# Patient Record
Sex: Female | Born: 1985 | Race: White | Hispanic: No | Marital: Single | Smoking: Current every day smoker
Health system: Southern US, Community
[De-identification: ages and names within clinical notes are randomized; demographics above are authoritative.]

## PROBLEM LIST (undated history)

## (undated) DIAGNOSIS — I10 Essential (primary) hypertension: Secondary | ICD-10-CM

## (undated) DIAGNOSIS — J40 Bronchitis, not specified as acute or chronic: Secondary | ICD-10-CM

## (undated) HISTORY — PX: TUBAL LIGATION: SHX77

## (undated) HISTORY — PX: ESSURE TUBAL LIGATION: SUR464

---
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
## 2004-02-15 ENCOUNTER — Encounter: Admission: RE | Admit: 2004-02-15 | Discharge: 2004-02-15 | Payer: Self-pay | Admitting: Family Medicine

## 2004-02-16 ENCOUNTER — Inpatient Hospital Stay (HOSPITAL_COMMUNITY): Admission: AD | Admit: 2004-02-16 | Discharge: 2004-02-18 | Payer: Self-pay | Admitting: Family Medicine

## 2004-03-24 ENCOUNTER — Other Ambulatory Visit: Admission: RE | Admit: 2004-03-24 | Discharge: 2004-03-24 | Payer: Self-pay | Admitting: Family Medicine

## 2004-11-24 ENCOUNTER — Emergency Department (HOSPITAL_COMMUNITY): Admission: EM | Admit: 2004-11-24 | Discharge: 2004-11-24 | Payer: Self-pay | Admitting: Emergency Medicine

## 2004-11-25 ENCOUNTER — Other Ambulatory Visit: Admission: RE | Admit: 2004-11-25 | Discharge: 2004-11-25 | Payer: Self-pay | Admitting: *Deleted

## 2005-02-24 IMAGING — US US OB COMP LESS 14 WK
1 series · 18 of 28 positions shown · non-contrast
Comparison: none

CLINICAL DATA: Uncertain menstrual dates.  Suspect approximately 6 weeks pregnant according to patient.  Evaluate viability and dating.
 OBSTETRICAL ULTRASOUND:
 A single living intrauterine fetus is seen with measured heart rate of 162.  Fetal movement is noted.  The crown rump length measures 4.7 cm, corresponding with a gestational age of 11 weeks 4 days.  Amniotic fluid volume is within normal limits.  Fetal anatomy could not be evaluated, however no gross morphologic abnormalities are identified.  
 No maternal uterine abnormalities are seen.  Both ovaries are visualized and are normal in appearance.  There is no evidence of adnexal masses or free fluid.
 IMPRESSION
 Single living intrauterine gestation with mean gestational age of 11 weeks 4 days and sonographic EDC of 02/11/04.  Patient?s stated LMP is inaccurate.
 Normal appearance of both ovaries.  No evidence of adnexal mass or free fluid.

[Series 1: us ob comp<14 wk · 18 of 31 slices shown]
[im 1/31]
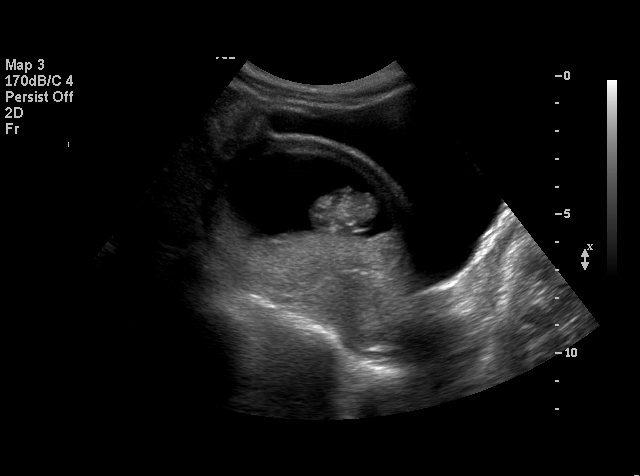
[im 3/31]
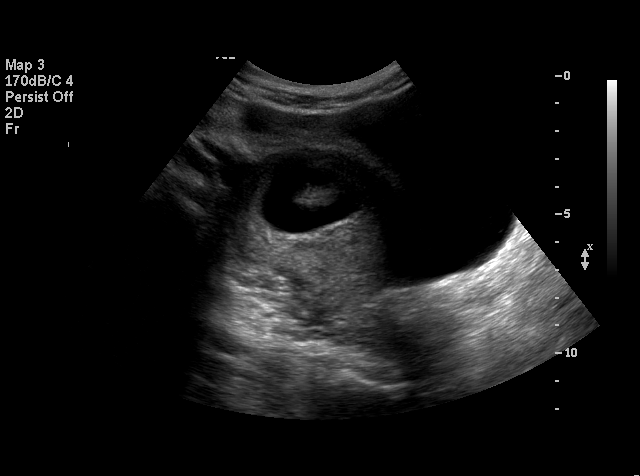
[im 4/31]
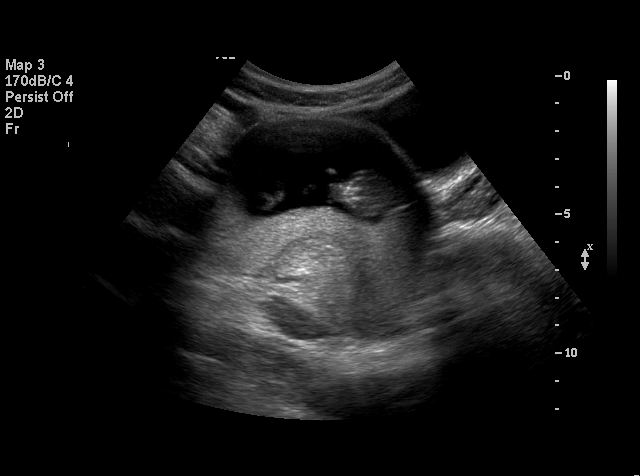
[im 6/31]
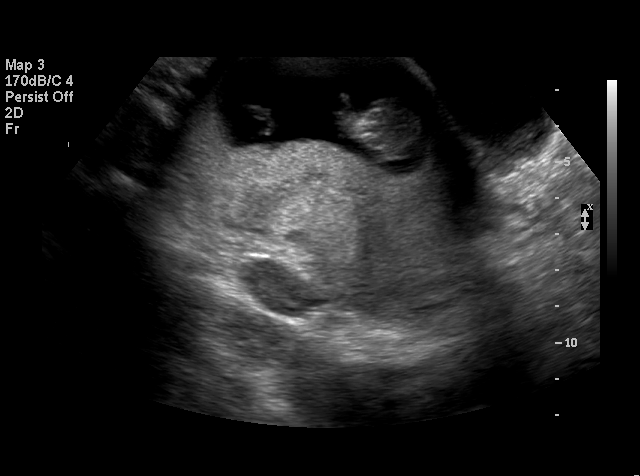
[im 8/31]
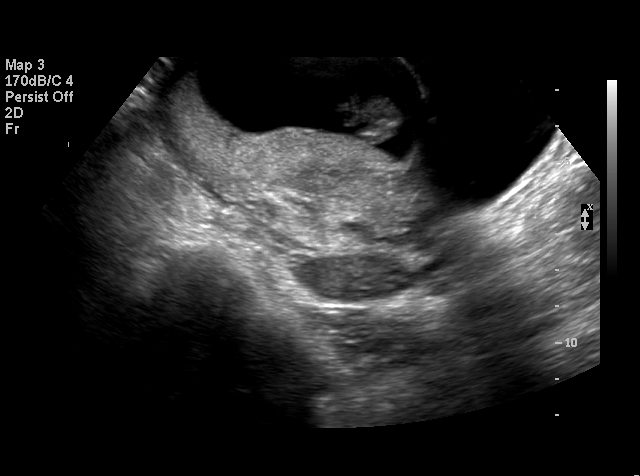
[im 9/31]
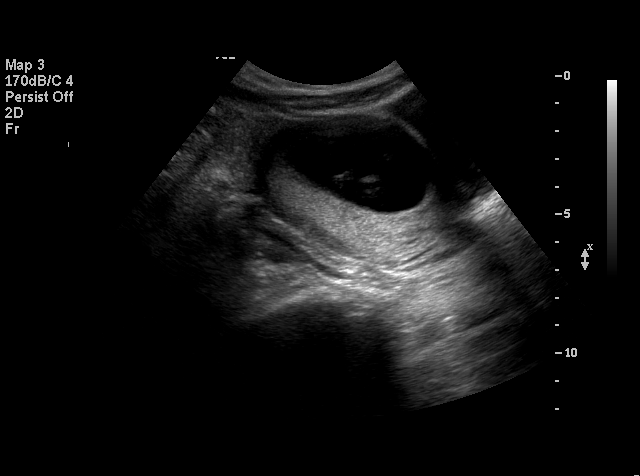
[im 12/31]
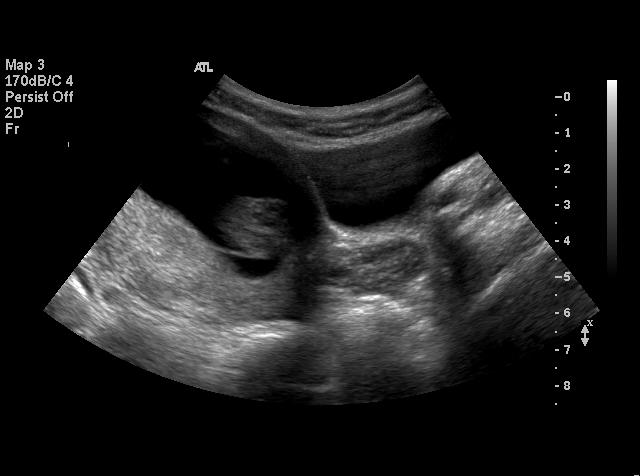
[im 13/31]
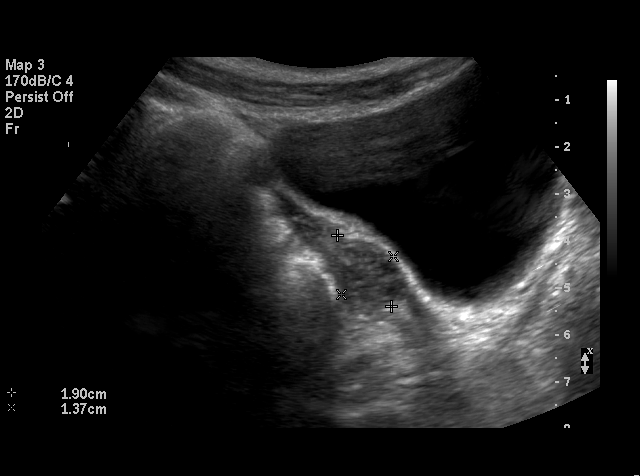
[im 15/31]
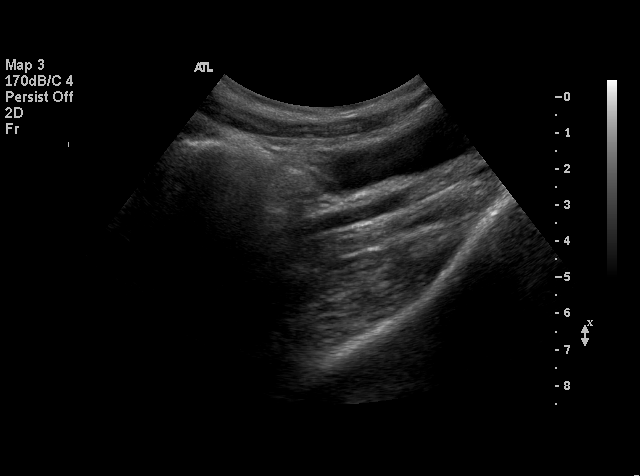
[im 16/31]
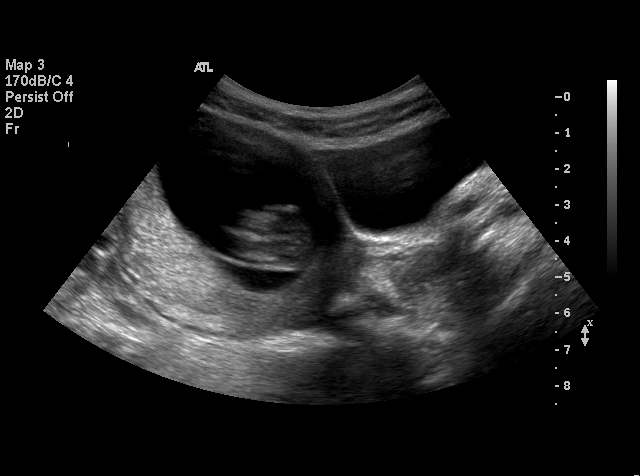
[im 18/31]
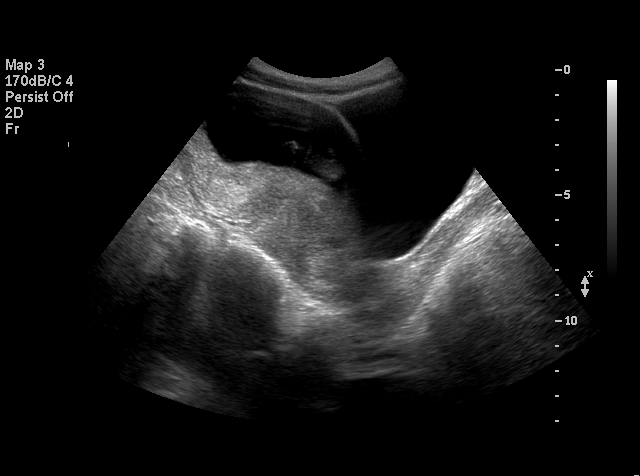
[im 19/31]
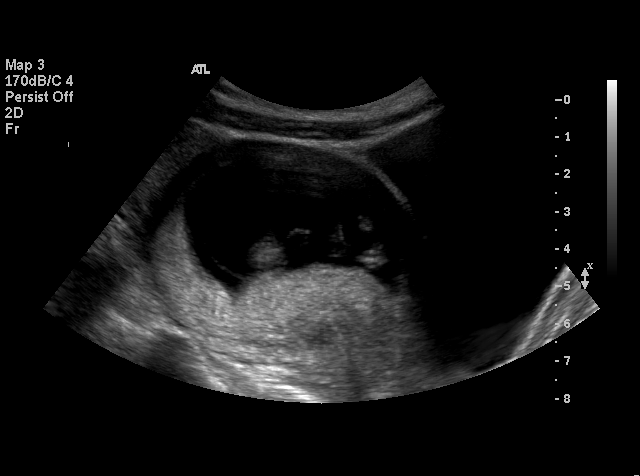
[im 22/31]
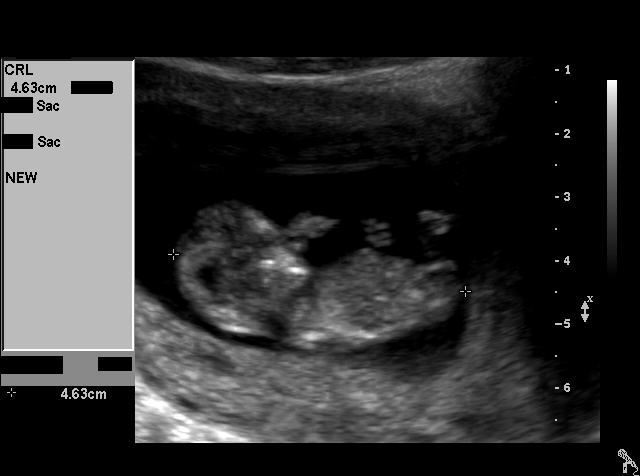
[im 24/31]
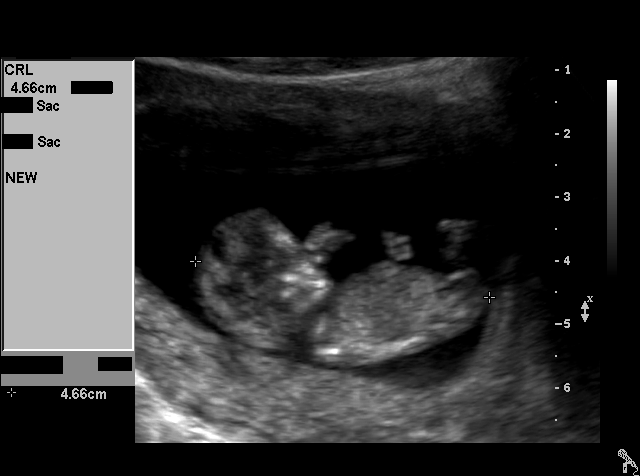
[im 25/31]
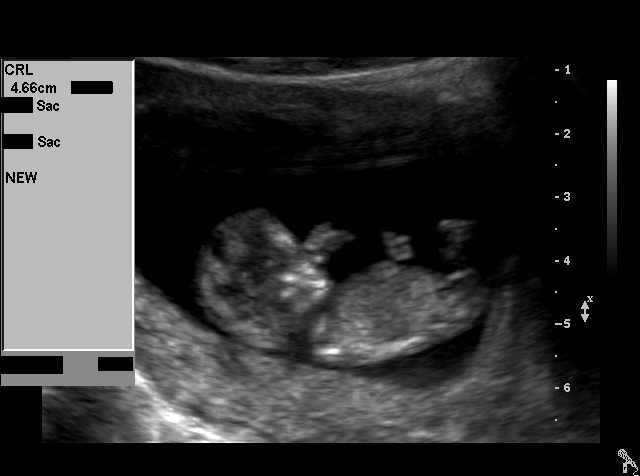
[im 27/31]
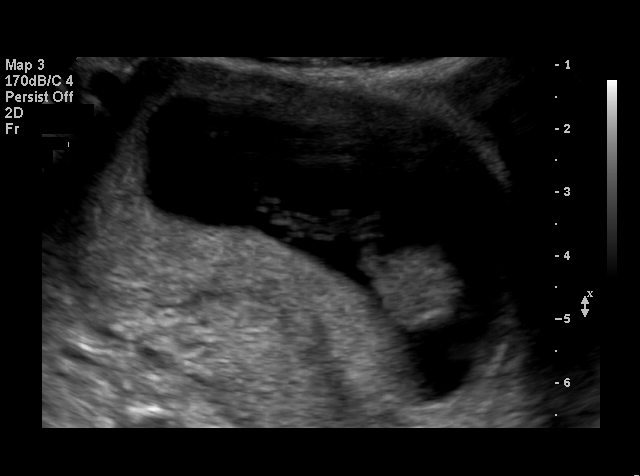
[im 28/31]
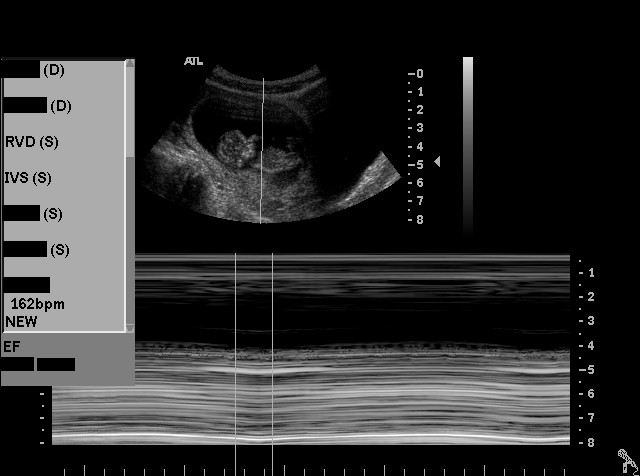
[im 31/31]
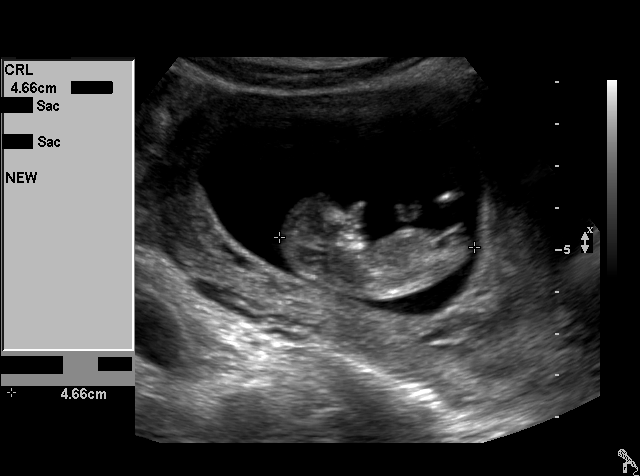

[18 of 28 positions shown; findings below may reference images not displayed]

## 2006-03-27 ENCOUNTER — Emergency Department (HOSPITAL_COMMUNITY): Admission: EM | Admit: 2006-03-27 | Discharge: 2006-03-27 | Payer: Self-pay | Admitting: Emergency Medicine

## 2006-10-21 ENCOUNTER — Ambulatory Visit (HOSPITAL_COMMUNITY): Admission: AD | Admit: 2006-10-21 | Discharge: 2006-10-21 | Payer: Self-pay | Admitting: Obstetrics and Gynecology

## 2006-11-05 ENCOUNTER — Inpatient Hospital Stay (HOSPITAL_COMMUNITY): Admission: RE | Admit: 2006-11-05 | Discharge: 2006-11-06 | Payer: Self-pay | Admitting: Obstetrics and Gynecology

## 2007-07-03 ENCOUNTER — Emergency Department (HOSPITAL_COMMUNITY): Admission: EM | Admit: 2007-07-03 | Discharge: 2007-07-03 | Payer: Self-pay | Admitting: Emergency Medicine

## 2010-08-20 ENCOUNTER — Encounter: Payer: Self-pay | Admitting: Family Medicine

## 2010-11-30 ENCOUNTER — Emergency Department (HOSPITAL_BASED_OUTPATIENT_CLINIC_OR_DEPARTMENT_OTHER)
Admission: EM | Admit: 2010-11-30 | Discharge: 2010-11-30 | Disposition: A | Discharge status: Home / Self Care | Payer: Self-pay | Attending: Emergency Medicine | Admitting: Emergency Medicine

## 2010-11-30 DIAGNOSIS — M549 Dorsalgia, unspecified: Secondary | ICD-10-CM | POA: Insufficient documentation

## 2010-11-30 DIAGNOSIS — F17200 Nicotine dependence, unspecified, uncomplicated: Secondary | ICD-10-CM | POA: Insufficient documentation

## 2010-11-30 LAB — URINALYSIS, ROUTINE W REFLEX MICROSCOPIC
Bilirubin Urine: NEGATIVE
Glucose, UA: NEGATIVE mg/dL
Hgb urine dipstick: NEGATIVE
Ketones, ur: NEGATIVE mg/dL
Nitrite: NEGATIVE
Protein, ur: NEGATIVE mg/dL
Specific Gravity, Urine: 1.006 (ref 1.005–1.030)
Urobilinogen, UA: 0.2 mg/dL (ref 0.0–1.0)
pH: 6.5 (ref 5.0–8.0)

## 2010-11-30 LAB — PREGNANCY, URINE: Preg Test, Ur: NEGATIVE

## 2010-12-16 NOTE — H&P (Signed)
NAMESUNNIE, ODDEN                ACCOUNT NO.:  [id number]   MEDICAL RECORD NO.:  [id number]          PATIENT TYPE:  INP   LOCATION:  LDR2                          FACILITY:  [facility]   PHYSICIAN:  Tilda Burrow, M.D. DATE OF BIRTH:  May 04, 1986   DATE OF ADMISSION:  11/05/2006  DATE OF DISCHARGE:  LH                              HISTORY & PHYSICAL   REASON FOR ADMISSION:  Pregnancy at 39-1/2 weeks with early active  labor.   MEDICAL HISTORY:  Negative.   SURGICAL HISTORY:  Negative.   ALLERGIES:  SHE HAS NO KNOWN ALLERGIES.   FAMILY HISTORY:  Also benign.   PRENATAL COURSE:  Uneventful.  Blood type is A positive.  Rubella  immune.  Hepatitis B surface antigen negative.  HIV is negative.  HSV-II  is negative.  Serologies nonreactive.  GC and chlamydia are negative.  AFP normal.  Twenty-eight week hemoglobin 11.4, twenty-eight week  hematocrit 35.5.  One-hour glucose is 105.   PHYSICAL EXAM:  VITAL SIGNS:  Stable.  PELVIC:  Cervix is 4 to 5 cm.  Bulging membranes.   PLAN:  We are going to admit and expect vaginal delivery.      Zerita Boers, Lanier Clam      Tilda Burrow, M.D.  Electronically Signed    DL/MEDQ  D:  16/05/9603  T:  11/05/2006  Job:  [id number]   cc:   Jeoffrey Massed, MD  Fax: [phone]

## 2010-12-16 NOTE — Consult Note (Signed)
[name], [name]                ACCOUNT NO.:  [id number]   MEDICAL RECORD NO.:  [id number]          PATIENT TYPE:  OIB   LOCATION:  A415                          FACILITY:  [facility]   PHYSICIAN:  Lazaro Arms, M.D.   DATE OF BIRTH:  16-Feb-1986   DATE OF CONSULTATION:  DATE OF DISCHARGE:  10/21/2006                                 CONSULTATION   Whitney [name] presents to Labor & Delivery at about 1530.  She is a 25-year-  old white female gravida 2, para 1, estimated date of delivery November 08, 2006, currently at 37-1/[redacted] weeks gestation who complains of a trickle of  fluid per vagina.  She stated this happened after she ate [employer].  She reports good fetal movement, no bleeding, no intercourse, really no  other complaints.  She came in and Berniece Pap did an amnio swab which  was negative, I came up and did a sterile speculum exam and there is no  fluid in the vault.  She does have some cervical mucous but no fluid  even with coughing.  Amnio swab is completely negative.  There is a  reactive NST.  The cervix is 2, still pretty thick and -1 station,  vertex and soft.  She was reassured, there was no rupture of membranes.  She was given routine aftercare instructions and she will be seen in the  office for a regular appointment or return as needed.      Lazaro Arms, M.D.  Electronically Signed     LHE/MEDQ  D:  10/21/2006  T:  10/21/2006  Job:  [id number]

## 2010-12-16 NOTE — Op Note (Signed)
NAMEPRUDENCE, HEINY                ACCOUNT NO.:  [id number]   MEDICAL RECORD NO.:  [id number]          PATIENT TYPE:  INP   LOCATION:  LDR2                          FACILITY:  [facility]   PHYSICIAN:  Lazaro Arms, M.D.   DATE OF BIRTH:  19-Apr-1986   DATE OF PROCEDURE:  DATE OF DISCHARGE:                               OPERATIVE REPORT   EPIDURAL NOTE:   Whitney [name] is a 25 year old  gravida 2, para 1, 4-5 cm dilated. had  amniotomy performed and is requesting epidural be placed.  She is placed  in a sitting position. Betadine prep is used.  1% Lidocaine is injected  in the L3-L4 interspace.  A 17 gauge Touhy needle is used and loss of  resistance technique is employed and the epidural space is found with 1  pass without difficulty.  The epidural catheter is then fed and taped  down 5 cm into the epidural space.  10cc of 0.125% Bupivacaine was given  as a test dose without ill effects.  I waited several minutes.  An  additional 10 cc is used to doses of  the epidural __________ infusion  of 0.125 % Bupivacaine with 2 mc per cc of Fentanyl was begun at 12 cc  an our.  The patient is getting good pain relief.  Fetal heart rate  tracing is stable and blood pressure is stable.      Lazaro Arms, M.D.  Electronically Signed     LHE/MEDQ  D:  11/05/2006  T:  11/05/2006  Job:  [id number]

## 2010-12-16 NOTE — Op Note (Signed)
[name], [name]                ACCOUNT NO.:  [id number]   MEDICAL RECORD NO.:  [id number]          PATIENT TYPE:  INP   LOCATION:  A401                          FACILITY:  [facility]   PHYSICIAN:  Tilda Burrow, M.D. DATE OF BIRTH:  Sep 02, 1985   DATE OF PROCEDURE:  11/05/2006  DATE OF DISCHARGE:  11/06/2006                               OPERATIVE REPORT   DELIVERY NOTE.   [name] progressed nicely, reached 9 cm dilated.  I was available so  presented to Labor & Delivery where I managed the delivery.  The infant  was delivered after a brief second stage of 12 minutes, delivering a  healthy 5 pound 8 ounce infant female, Apgars 9/9, delivered without  difficulty over an intact perineum without complications or lacerations.  Patient tolerated the procedure well.  After the baby was delivered,  placed on  maternal abdomen cord blood samples were obtained and the  placenta delivered easily intact.  Three-vessel cord confirmed.  EBL was  minimal, 250 mL estimate, and patient tolerated the procedure well with  excellent appropriate interest in the infant.      Tilda Burrow, M.D.  Electronically Signed     JVF/MEDQ  D:  11/15/2006  T:  11/16/2006  Job:  [id number]

## 2011-05-08 LAB — DIFFERENTIAL
Basophils Absolute: 0
Basophils Relative: 0
Eosinophils Absolute: 0.1 — ABNORMAL LOW
Eosinophils Relative: 1
Lymphocytes Relative: 17
Lymphs Abs: 1.7
Monocytes Absolute: 0.8
Monocytes Relative: 8
Neutro Abs: 7.2
Neutrophils Relative %: 74

## 2011-05-08 LAB — COMPREHENSIVE METABOLIC PANEL
ALT: 16
AST: 17
Albumin: 4
Alkaline Phosphatase: 69
BUN: 8
CO2: 22
Calcium: 9.2
Chloride: 105
Creatinine, Ser: 0.63
GFR calc Af Amer: >60
GFR calc non Af Amer: >60
Glucose, Bld: 83
Potassium: 3.8
Sodium: 135
Total Bilirubin: 0.7
Total Protein: 6.8

## 2011-05-08 LAB — URINALYSIS, ROUTINE W REFLEX MICROSCOPIC
Bilirubin Urine: NEGATIVE
Glucose, UA: NEGATIVE
Hgb urine dipstick: NEGATIVE
Ketones, ur: NEGATIVE
Nitrite: NEGATIVE
Protein, ur: NEGATIVE
Specific Gravity, Urine: 1.014
Urobilinogen, UA: 1
pH: 6

## 2011-05-08 LAB — CBC
HCT: 38
Hemoglobin: 12.8
MCHC: 33.6
MCV: 89.1
Platelets: 222
RBC: 4.27
RDW: 14.3
WBC: 9.8

## 2011-05-08 LAB — LIPASE, BLOOD: Lipase: 15

## 2011-05-08 LAB — PREGNANCY, URINE: Preg Test, Ur: NEGATIVE

## 2013-06-19 ENCOUNTER — Encounter (HOSPITAL_BASED_OUTPATIENT_CLINIC_OR_DEPARTMENT_OTHER): Payer: Self-pay | Admitting: Emergency Medicine

## 2013-06-19 ENCOUNTER — Emergency Department (HOSPITAL_BASED_OUTPATIENT_CLINIC_OR_DEPARTMENT_OTHER): Payer: Self-pay

## 2013-06-19 ENCOUNTER — Emergency Department (HOSPITAL_BASED_OUTPATIENT_CLINIC_OR_DEPARTMENT_OTHER)
Admission: EM | Admit: 2013-06-19 | Discharge: 2013-06-19 | Disposition: A | Discharge status: Home / Self Care | Payer: Self-pay | Attending: Emergency Medicine | Admitting: Emergency Medicine

## 2013-06-19 DIAGNOSIS — J3489 Other specified disorders of nose and nasal sinuses: Secondary | ICD-10-CM | POA: Insufficient documentation

## 2013-06-19 DIAGNOSIS — R05 Cough: Secondary | ICD-10-CM

## 2013-06-19 DIAGNOSIS — F17200 Nicotine dependence, unspecified, uncomplicated: Secondary | ICD-10-CM | POA: Insufficient documentation

## 2013-06-19 DIAGNOSIS — J029 Acute pharyngitis, unspecified: Secondary | ICD-10-CM | POA: Insufficient documentation

## 2013-06-19 DIAGNOSIS — R509 Fever, unspecified: Secondary | ICD-10-CM | POA: Insufficient documentation

## 2013-06-19 DIAGNOSIS — R059 Cough, unspecified: Secondary | ICD-10-CM | POA: Insufficient documentation

## 2013-06-19 MED ORDER — HYDROCOD POLST-CHLORPHEN POLST 10-8 MG/5ML PO LQCR: 5.0000 mL | Freq: Two times a day (BID) | ORAL | Status: DC | PRN

## 2013-06-19 NOTE — ED Notes (Signed)
Pt reports cough and fever x 1 week and burning feeling in chest and no relief from over the counter medication

## 2013-06-19 NOTE — ED Notes (Addendum)
Cough congestion x 1 week  Hot and chills,  Lungs clear,  States chest feels tight and burning

## 2013-06-19 NOTE — ED Provider Notes (Signed)
CSN: [id number]     Arrival date & time 06/19/13  0221 History   First MD Initiated Contact with Patient 06/19/13 0236     Chief Complaint  Patient presents with  . Cough   (Consider location/radiation/quality/duration/timing/severity/associated sxs/prior Treatment) HPI This is a 27 year old female with a one-week history of cough. The cough is nonproductive. It has been associated with nasal congestion, sore throat, burning in chest and subjective fever. She has not had an earache. She's had no relief from over-the-counter medications. Her cough worsened this morning and is worse lying supine. It is less severe during the daytime.  History reviewed. No pertinent past medical history. Past Surgical History  Procedure Laterality Date  . Essure tubal ligation     History reviewed. No pertinent family history. History  Substance Use Topics  . Smoking status: Current Every Day Smoker -- 0.50 packs/day    Types: Cigarettes  . Smokeless tobacco: Not on file  . Alcohol Use: No   OB History   Grav Para Term Preterm Abortions TAB SAB Ect Mult Living                 Review of Systems  All other systems reviewed and are negative.    Allergies  Percocet  Home Medications   Current Outpatient Rx  Name  Route  Sig  Dispense  Refill  . dextromethorphan 15 MG/5ML syrup   Oral   Take 10 mLs by mouth 4 (four) times daily as needed for cough.          BP 120/65  Pulse 110  Temp(Src) 98.4 F (36.9 C) (Oral)  Resp 20  Ht 5\' 1"  (1.549 m)  Wt 110 lb (49.896 kg)  BMI 20.80 kg/m2  SpO2 100%  LMP 05/15/2013  Physical Exam General: Well-developed, well-nourished female in no acute distress; appearance consistent with age of record HENT: normocephalic; atraumatic; no pharyngeal erythema or exudate; nasal congestion Eyes: pupils equal, round and reactive to light; extraocular muscles intact Neck: supple Heart: regular rate and rhythm Lungs: clear to auscultation bilaterally;  frequent cough Chest: anterior tenderness Abdomen: soft; nondistended Extremities: No deformity; full range of motion Neurologic: Awake, alert and oriented; motor function intact in all extremities and symmetric; no facial droop Skin: Warm and dry Psychiatric: Normal mood and affect    ED Course  Procedures (including critical care time)  MDM  Nursing notes and vitals signs, including pulse oximetry, reviewed.  Summary of this visit's results, reviewed by myself:  Imaging Studies: Dg Chest 2 View  06/19/2013   CLINICAL DATA:  Cough and fever for 1 week.  EXAM: CHEST  2 VIEW  COMPARISON:  None available for comparison at time of study interpretation.  FINDINGS: Cardiomediastinal silhouette is unremarkable. The lungs are clear without pleural effusions or focal consolidations. Pulmonary vasculature is unremarkable. Trachea projects midline and there is no pneumothorax. Soft tissue planes and included osseous structures are nonsuspicious.  IMPRESSION: No active cardiopulmonary disease.  Normal chest.   Electronically Signed   By: Awilda Metro   On: 06/19/2013 02:53        Hanley Seamen, MD 06/19/13 4098

## 2014-07-10 ENCOUNTER — Emergency Department (HOSPITAL_BASED_OUTPATIENT_CLINIC_OR_DEPARTMENT_OTHER)
Admission: EM | Admit: 2014-07-10 | Discharge: 2014-07-10 | Disposition: A | Discharge status: Home / Self Care | Payer: Managed Care, Other (non HMO) | Attending: Emergency Medicine | Admitting: Emergency Medicine

## 2014-07-10 ENCOUNTER — Encounter (HOSPITAL_BASED_OUTPATIENT_CLINIC_OR_DEPARTMENT_OTHER): Payer: Self-pay

## 2014-07-10 ENCOUNTER — Emergency Department (HOSPITAL_BASED_OUTPATIENT_CLINIC_OR_DEPARTMENT_OTHER): Payer: Managed Care, Other (non HMO)

## 2014-07-10 DIAGNOSIS — R0602 Shortness of breath: Secondary | ICD-10-CM | POA: Diagnosis present

## 2014-07-10 DIAGNOSIS — J209 Acute bronchitis, unspecified: Secondary | ICD-10-CM | POA: Diagnosis not present

## 2014-07-10 DIAGNOSIS — R05 Cough: Secondary | ICD-10-CM

## 2014-07-10 DIAGNOSIS — Z720 Tobacco use: Secondary | ICD-10-CM | POA: Insufficient documentation

## 2014-07-10 DIAGNOSIS — R059 Cough, unspecified: Secondary | ICD-10-CM

## 2014-07-10 HISTORY — DX: Bronchitis, not specified as acute or chronic: J40

## 2014-07-10 MED ORDER — PREDNISONE 20 MG PO TABS
40.0000 mg | ORAL_TABLET | Freq: Once | ORAL | Status: AC
  Administered 2014-07-10: 40 mg via ORAL
  Filled 2014-07-10: qty 2

## 2014-07-10 MED ORDER — ALBUTEROL SULFATE HFA 108 (90 BASE) MCG/ACT IN AERS: 2.0000 | INHALATION_SPRAY | RESPIRATORY_TRACT | Status: DC | PRN

## 2014-07-10 MED ORDER — ALBUTEROL SULFATE (2.5 MG/3ML) 0.083% IN NEBU
5.0000 mg | INHALATION_SOLUTION | Freq: Once | RESPIRATORY_TRACT | Status: AC
  Administered 2014-07-10: 5 mg via RESPIRATORY_TRACT
  Filled 2014-07-10: qty 6

## 2014-07-10 MED ORDER — AEROCHAMBER PLUS W/MASK MISC: Status: DC

## 2014-07-10 MED ORDER — PREDNISONE 10 MG PO TABS: 40.0000 mg | ORAL_TABLET | Freq: Every day | ORAL | Status: DC

## 2014-07-10 NOTE — Discharge Instructions (Signed)
Acute Bronchitis Start taking the prednisone prescribed tomorrow morning. Use your inhaler 2 puffs every 4 hours as needed for cough or shortness of breath. Return if needed more than every 4 hours. Call any of the numbers on the resource guide to get a primary care physician. Ask your new physician to help you to stop smoking. Bronchitis is when the airways that extend from the windpipe into the lungs get red, puffy, and painful (inflamed). Bronchitis often causes thick spit (mucus) to develop. This leads to a cough. A cough is the most common symptom of bronchitis. In acute bronchitis, the condition usually begins suddenly and goes away over time (usually in 2 weeks). Smoking, allergies, and asthma can make bronchitis worse. Repeated episodes of bronchitis may cause more lung problems. HOME CARE  Rest.  Drink enough fluids to keep your pee (urine) clear or pale yellow (unless you need to limit fluids as told by your doctor).  Only take over-the-counter or prescription medicines as told by your doctor.  Avoid smoking and secondhand smoke. These can make bronchitis worse. If you are a smoker, think about using nicotine gum or skin patches. Quitting smoking will help your lungs heal faster.  Reduce the chance of getting bronchitis again by:  Washing your hands often.  Avoiding people with cold symptoms.  Trying not to touch your hands to your mouth, nose, or eyes.  Follow up with your doctor as told. GET HELP IF: Your symptoms do not improve after 1 week of treatment. Symptoms include:  Cough.  Fever.  Coughing up thick spit.  Body aches.  Chest congestion.  Chills.  Shortness of breath.  Sore throat. GET HELP RIGHT AWAY IF:   You have an increased fever.  You have chills.  You have severe shortness of breath.  You have bloody thick spit (sputum).  You throw up (vomit) often.  You lose too much body fluid (dehydration).  You have a severe headache.  You  faint. MAKE SURE YOU:   Understand these instructions.  Will watch your condition.  Will get help right away if you are not doing well or get worse. Document Released: 01/03/2008 Document Revised: 03/19/2013 Document Reviewed: 01/07/2013 [facility] Patient Information 2015 [place] [place], [place]. This information is not intended to replace advice given to you by your health care provider. Make sure you discuss any questions you have with your health care provider.  Emergency Department Resource Guide 1) Find a Doctor and Pay Out of Pocket Although you won't have to find out who is covered by your insurance plan, it is a good idea to ask around and get recommendations. You will then need to call the office and see if the doctor you have chosen will accept you as a new patient and what types of options they offer for patients who are self-pay. Some doctors offer discounts or will set up payment plans for their patients who do not have insurance, but you will need to ask so you aren't surprised when you get to your appointment.  2) Contact Your Local Health Department Not all health departments have doctors that can see patients for sick visits, but many do, so it is worth a call to see if yours does. If you don't know where your local health department is, you can check in your phone book. The CDC also has a tool to help you locate your state's health department, and many state websites also have listings of all of their local health departments.  3)  Find a Walk-in Clinic If your illness is not likely to be very severe or complicated, you may want to try a walk in clinic. These are popping up all over the country in pharmacies, drugstores, and shopping centers. They're usually staffed by nurse practitioners or physician assistants that have been trained to treat common illnesses and complaints. They're usually fairly quick and inexpensive. However, if you have serious medical issues or chronic medical  problems, these are probably not your best option.  No Primary Care Doctor: - Call Health Connect at  [phone] - they can help you locate a primary care doctor that  accepts your insurance, provides certain services, etc. - Physician Referral Service- [phone]  Chronic Pain Problems: Organization         Address  Phone   Notes  [facility]  [phone] Patients need to be referred by their primary care doctor.   Medication Assistance: Organization         Address  Phone   Notes  [facility] Medication [facility] [address] [place] [place], [place] [address] [phone] --Must be a resident of [facility] -- Must have NO insurance coverage whatsoever (no Medicaid/ Medicare, etc.) -- The pt. MUST have a primary care doctor that directs their care regularly and follows them in the community   [facility]  [phone]   [employer]  [phone]    Agencies that provide inexpensive medical care: Organization         Address  Phone   Notes  [facility]  [phone]   [facility]    [phone]   [facility] [address] [place], [place] [address] [phone]   [facility] [address] [place] [place] [phone]   Planned Parenthood    [phone]   [facility]    [phone]   [facility]  [address], [place] Phone:  [phone], Fax:  [phone] Hours of Operation:  9 am - 6 pm, M-F.  Also accepts Medicaid/Medicare and self-pay.  [facility]  [address], [place] Phone: [phone], Fax: [phone]. Hours of Operation:  8:30 am - 5:30 pm, M-F.  Also accepts Medicaid and self-pay.  [facility] [place] [address] [place] Phone: [phone]   [facility] [address] Natasha Bence [place], [place] [phone], Ext. [phone]  Mondays & Thursdays: 7-9 AM.  First 15 patients are seen on a first come, first serve basis.    Medicaid-accepting [facility] Providers:  Organization         Address  Phone   Notes  [facility] [address], [place] [phone] Also accepts self-pay patients.  [facility] [address] Laurell Josephs [place], [place]  [phone]   [facility] [address], [place] [phone]   [facility] [address], [place] [phone]   Renaye Rakers [address], [place]   [phone] Only accepts [place] Access [place] patients after they have their name applied to their card.   Self-Pay (no insurance) in [facility]:  Organization         Address  Phone   Notes  Sickle Cell Patients, [facility] [address] [phone]   [facility] [place] [phone]   [facility] [place]  [address], [place] [phone]   [facility]/Dr. Osei-Bonsu  [address] [place] or [address], [place] [phone] Phone number for both [place] and [place] locations is the same.  [facility] [address] [place] [phone]   [facility] [address] [place] or [address] [phone]   [facility] [address] [place] [phone], phone; [phone], fax Sees patients 1st and 3rd Saturday of every month.  Must not qualify for public or private insurance (i.e. Medicaid, Medicare, [place] Health Choice, Veterans' Benefits)  Household income should be no more than 200% of the poverty level The clinic cannot treat you if you are pregnant or think you are pregnant  Sexually transmitted diseases are not treated at  the clinic.    Dental Care: Organization         Address  Phone  Notes  [facility] Department of [facility] [place] [phone] Accepts children up to age 56 who are enrolled in [place] or [place]; pregnant women with a Medicaid card; and children who have applied for Medicaid or [place] Health Choice, but were declined, whose parents can pay a reduced fee at time of service.  [facility] Department of [facility]  [address], [place] [phone] Accepts children up to age 65 who are enrolled in [place] or [place]; pregnant women with a Medicaid card; and children who have applied for Medicaid or [place] Health Choice, but were declined, whose parents can pay a reduced fee at time of service.  [place] Adult [facility] Access PROGRAM  [place] [phone] Patients are seen by appointment only. Walk-ins are not accepted. [place] will see patients 79 years of age and older. Monday - Tuesday (8am-5pm) Most Wednesdays (8:30-5pm) $30 per visit, cash only  [facility] Adult Dental Access PROGRAM  [address], [facility] [phone] Patients are seen by appointment only. Walk-ins are not accepted. [place] will see patients 29 years of age and older. One Wednesday Evening (Monthly: Volunteer Based).  $30 per visit, cash only  [place]  [phone] for adults; Children under age 28, call Graduate Pediatric Dentistry at [phone]. Children aged 15-14, please call [phone] to request a pediatric application.  Dental services are provided in all areas of dental care including fillings, crowns and bridges, complete and partial dentures, implants, gum treatment, root canals, and extractions. Preventive care is also provided. Treatment is provided to both adults and children. Patients are selected via a lottery and there is often a  waiting list.   [facility] [address], [place]  [phone] [email or web address]   [facility] [address] [place], [place] [phone], Ext. [phone] Second and Fourth Thursday of each month, opens at 6:30 AM; Clinic ends at 9 AM.  Patients are seen on a first-come first-served basis, and a limited number are seen during each clinic.   [facility]  [address] Hillard Danker [place], [place] [phone]   Eligibility Requirements You must have lived in [place], [place],  or [place] counties for at least the last three months.   You cannot be eligible for state or federal sponsored [employer], including [employer], [place], or [employer].   You generally cannot be eligible for healthcare insurance through your employer.    How to apply: Eligibility screenings are held every Tuesday and Wednesday afternoon from 1:00 pm until 4:00 pm. You do not need an appointment for the interview!  [facility] [address], [place], [place]   [place]  [place] Department  [place]  [phone]    Behavioral Health Resources in the Community: Intensive Outpatient Programs Organization         Address  Phone  Notes  [place] Peever. [address] [place], [place] [phone]   [facility] Outpatient [address] [place], [place]   ADS: [facility] [address] [place], [place]   [place] [address],  [place], [place] or [phone]   Substance Abuse Resources Organization         Address  Phone  Notes  Alcohol and Drug Services  [phone]   [place]  [phone]   [facility] [place]   Chinita Pester  [phone]   Residential & Outpatient Substance Abuse  Program  [phone]   Psychological Services Organization         Address  Phone  Notes  [facility] [place]  [place]  [phone]   [place] [address], [place] or [phone]    Mobile Crisis Teams Organization         Address  Phone  Notes  [facility], Mobile Crisis Care Unit  [phone]   [facility]  [address] [place], [place]   [place] [address] [place] [phone]    Self-Help/Support Groups Organization         Address  Phone             Notes  [place]. of [place] - variety of support groups  [place] Call for more information  Narcotics Anonymous (NA), [facility] [address], [employer] [place]  2 meetings at this location   Special educational needs teacher         Address  Phone  Notes  [facility] [place],    [place]  [phone]   [facility]  [address], [place] [phone], [place], [place]   [place], [place] [phone] Admissions: 8am-3pm M-F  Incentives Substance [place] [address]    [place], [place] [phone]   [facility] [address] Jadene Pierini [place], [place]   [facility] [address],  [place], [place]   [facility] Programs - Intensive Outpatient [address] Kristeen [address], [place], [place]   [facility] ([place].) [place].,  [place], [place] or [phone]   [facility] (RTS) [address], [place], [place] Accepts Medicaid  [facility] [place] [address],  [place] [phone] Substance Abuse/Addiction Treatment   [facility] Resources Organization          Address  Phone  Notes  [facility]  [phone]   Angie Fava, PhD [address] Ervin Knack [place], [place]   [phone] or [phone]   [facility] Behavioral   [address] [place], [place] [phone]   [facility] [address], [place], [place] [phone] Insurance/Medicaid/sponsorship through [facility] and Families [address]                                    [place], [place] [phone] Therapy/tele-psych/case  [facility] [address], [place] [phone]    Dr. Lolly Mustache  [phone]   [facility]  [employer] [facility] Dept. 1) [address], [place] 2) [address], [place] 3)  [address], [place] [phone]  [phone]   [facility] Child Abuse Hotline 203-371-0815 or [phone] (After Hours)

## 2014-07-10 NOTE — ED Provider Notes (Signed)
CSN: [id number]     Arrival date & time 07/10/14  0710 History   First MD Initiated Contact with Patient 07/10/14 [phone]     No chief complaint on file.  chief complaint cough shortness of breath   (Consider location/radiation/quality/duration/timing/severity/associated sxs/prior Treatment) HPI Complains of nonproductive cough, nasal congestion and shortness of breath onset 2 weeks ago. Treated with her child's nebulizerwith partial relief. Using dayquil and nyquil without relief she's also been using her husband's albuterol inhaler. She denies fever nothing makes symptoms better or worse. No other associated symptoms No past medical history on file. Past Surgical History  Procedure Laterality Date  . Essure tubal ligation     No family history on file. History  Substance Use Topics  . Smoking status: Current Every Day Smoker -- 0.50 packs/day    Types: Cigarettes  . Smokeless tobacco: Not on file  . Alcohol Use: No   no illicit drug use OB History    No data available     Review of Systems  Constitutional: Negative.   HENT: Positive for congestion.   Respiratory: Positive for cough and shortness of breath.   Cardiovascular: Negative.   Gastrointestinal: Negative.   Musculoskeletal: Negative.   Skin: Negative.   Neurological: Negative.   Psychiatric/Behavioral: Negative.   All other systems reviewed and are negative.     Allergies  Percocet  Home Medications   Prior to Admission medications   Medication Sig Start Date End Date Taking? Authorizing Provider  chlorpheniramine-HYDROcodone (TUSSIONEX PENNKINETIC ER) 10-8 MG/5ML LQCR Take 5 mLs by mouth every 12 (twelve) hours as needed for cough. 06/19/13   John L Molpus, MD   BP 127/67 mmHg  Pulse 75  Temp(Src) 98 F (36.7 C)  Resp 24  Ht 5' 1.5" (1.562 m)  Wt 103 lb (46.72 kg)  BMI 19.15 kg/m2  SpO2 100% Physical Exam  Constitutional: She appears well-developed and well-nourished. No distress.  HENT:  Head:  Normocephalic and atraumatic.  Right Ear: External ear normal.  Left Ear: External ear normal.  Mouth/Throat: Oropharynx is clear and moist. No oropharyngeal exudate.  Nasal congestion. Bilateral tympanic membranes normal  Eyes: Conjunctivae are normal. Pupils are equal, round, and reactive to light.  Neck: Neck supple. No tracheal deviation present. No thyromegaly present.  Cardiovascular: Normal rate and regular rhythm.   No murmur heard. Pulmonary/Chest: Effort normal and breath sounds normal.  Coughing occasionally  Abdominal: Soft. Bowel sounds are normal. She exhibits no distension. There is no tenderness.  Musculoskeletal: Normal range of motion. She exhibits no edema or tenderness.  Neurological: She is alert. Coordination normal.  Skin: Skin is warm and dry. No rash noted.  Psychiatric: She has a normal mood and affect.  Nursing note and vitals reviewed.   ED Course  Procedures (including critical care time) Labs Review Labs Reviewed - No data to display  Imaging Review No results found.   EKG Interpretation None     7:55 AM breathing normal patient not coughing after treatment with albuterol neb. Chest x-ray viewed by me Results for orders placed or performed during the hospital encounter of 11/30/10  Urinalysis, Routine w reflex microscopic  Result Value Ref Range   Color, Urine YELLOW YELLOW   APPearance CLEAR CLEAR   Specific Gravity, Urine 1.006 1.005 - 1.030   pH 6.5 5.0 - 8.0   Glucose, UA NEGATIVE NEGATIVE mg/dL   Hgb urine dipstick NEGATIVE NEGATIVE   Bilirubin Urine NEGATIVE NEGATIVE   Ketones, ur NEGATIVE NEGATIVE mg/dL  Protein, ur NEGATIVE NEGATIVE mg/dL   Urobilinogen, UA 0.2 0.0 - 1.0 mg/dL   Nitrite NEGATIVE NEGATIVE   Leukocytes, UA  NEGATIVE    NEGATIVE MICROSCOPIC NOT DONE ON URINES WITH NEGATIVE PROTEIN, BLOOD, LEUKOCYTES, NITRITE, OR GLUCOSE <1000 mg/dL.  Pregnancy, urine  Result Value Ref Range   Preg Test, Ur      NEGATIVE         THE SENSITIVITY OF THIS METHODOLOGY IS >24 mIU/mL   Dg Chest 2 View  07/10/2014   CLINICAL DATA:  Cough and fatigue.  Personal history of smoking.  EXAM: CHEST  2 VIEW  COMPARISON:  Two-view chest 06/19/2013  FINDINGS: Heart size is normal. Lungs are clear. The visualized soft tissues and bony thorax are unremarkable.  IMPRESSION: Negative two view chest.   Electronically Signed   By: Gennette Pachris  Mattern M.D.   On: 07/10/2014 07:35    MDM  Plan prescription prednisone his prednisone has helped her in the past. Prescription albuterol HFA with spacer use 2 puffs every 4 hours when necessary shortness of breath. I counseled patient for 5 minutes on smoking cessation Referral resource guide Diagnosis #1 acute bronchitis #2 tobacco abuse  Final diagnoses:  None        Doug SouSam Nicolette Gieske, MD 07/10/14 [phone]

## 2014-07-10 NOTE — ED Notes (Signed)
Patient transported to X-ray 

## 2014-07-10 NOTE — ED Notes (Signed)
MD at bedside. 

## 2014-07-10 NOTE — ED Notes (Signed)
Difficulty breathing and NP cough x x weeks that is progressively worsening.  Nasal congestion.  Taking Dayquil and NyQuil without relief.  Denies fever, however reports diarrhea yesterday.  Used an albuterol neb last night with relief. Denies CP, nausea or vomiting.

## 2016-03-18 ENCOUNTER — Emergency Department (HOSPITAL_BASED_OUTPATIENT_CLINIC_OR_DEPARTMENT_OTHER): Payer: BLUE CROSS/BLUE SHIELD

## 2016-03-18 ENCOUNTER — Encounter (HOSPITAL_BASED_OUTPATIENT_CLINIC_OR_DEPARTMENT_OTHER): Payer: Self-pay | Admitting: Emergency Medicine

## 2016-03-18 ENCOUNTER — Emergency Department (HOSPITAL_BASED_OUTPATIENT_CLINIC_OR_DEPARTMENT_OTHER)
Admission: EM | Admit: 2016-03-18 | Discharge: 2016-03-18 | Disposition: A | Discharge status: Home / Self Care | Payer: BLUE CROSS/BLUE SHIELD | Attending: Emergency Medicine | Admitting: Emergency Medicine

## 2016-03-18 DIAGNOSIS — N730 Acute parametritis and pelvic cellulitis: Secondary | ICD-10-CM | POA: Diagnosis not present

## 2016-03-18 DIAGNOSIS — F17210 Nicotine dependence, cigarettes, uncomplicated: Secondary | ICD-10-CM | POA: Insufficient documentation

## 2016-03-18 DIAGNOSIS — R1011 Right upper quadrant pain: Secondary | ICD-10-CM | POA: Diagnosis present

## 2016-03-18 LAB — URINALYSIS, ROUTINE W REFLEX MICROSCOPIC
Bilirubin Urine: NEGATIVE
Glucose, UA: NEGATIVE mg/dL
Ketones, ur: NEGATIVE mg/dL
Leukocytes, UA: NEGATIVE
Nitrite: NEGATIVE
Protein, ur: NEGATIVE mg/dL
Specific Gravity, Urine: 1.009 (ref 1.005–1.030)
pH: 6 (ref 5.0–8.0)

## 2016-03-18 LAB — CBC WITH DIFFERENTIAL/PLATELET
BASOS ABS: 0 10*3/uL (ref 0.0–0.1)
Basophils Relative: 0 %
Eosinophils Absolute: 0.1 10*3/uL (ref 0.0–0.7)
Eosinophils Relative: 1 %
HEMATOCRIT: 34.8 % — AB (ref 36.0–46.0)
Hemoglobin: 11.5 g/dL — ABNORMAL LOW (ref 12.0–15.0)
LYMPHS ABS: 1 10*3/uL (ref 0.7–4.0)
LYMPHS PCT: 6 %
MCH: 28.4 pg (ref 26.0–34.0)
MCHC: 33 g/dL (ref 30.0–36.0)
MCV: 85.9 fL (ref 78.0–100.0)
MONO ABS: 1.3 10*3/uL — AB (ref 0.1–1.0)
Monocytes Relative: 8 %
NEUTROS ABS: 14.2 10*3/uL — AB (ref 1.7–7.7)
Neutrophils Relative %: 85 %
Platelets: 234 10*3/uL (ref 150–400)
RBC: 4.05 MIL/uL (ref 3.87–5.11)
RDW: 15.8 % — AB (ref 11.5–15.5)
WBC: 16.6 10*3/uL — ABNORMAL HIGH (ref 4.0–10.5)

## 2016-03-18 LAB — URINE MICROSCOPIC-ADD ON

## 2016-03-18 LAB — COMPREHENSIVE METABOLIC PANEL
ALT: 11 U/L — AB (ref 14–54)
AST: 15 U/L (ref 15–41)
Albumin: 4.6 g/dL (ref 3.5–5.0)
Alkaline Phosphatase: 72 U/L (ref 38–126)
Anion gap: 8 (ref 5–15)
BILIRUBIN TOTAL: 0.7 mg/dL (ref 0.3–1.2)
BUN: 7 mg/dL (ref 6–20)
CO2: 24 mmol/L (ref 22–32)
CREATININE: 0.54 mg/dL (ref 0.44–1.00)
Calcium: 9.2 mg/dL (ref 8.9–10.3)
Chloride: 106 mmol/L (ref 101–111)
Glucose, Bld: 90 mg/dL (ref 65–99)
Potassium: 3 mmol/L — ABNORMAL LOW (ref 3.5–5.1)
Sodium: 138 mmol/L (ref 135–145)
TOTAL PROTEIN: 7.4 g/dL (ref 6.5–8.1)

## 2016-03-18 LAB — WET PREP, GENITAL
Sperm: NONE SEEN
Trich, Wet Prep: NONE SEEN
YEAST WET PREP: NONE SEEN

## 2016-03-18 LAB — PREGNANCY, URINE: Preg Test, Ur: NEGATIVE

## 2016-03-18 MED ORDER — DOXYCYCLINE HYCLATE 100 MG PO CAPS: 100.0000 mg | ORAL_CAPSULE | Freq: Two times a day (BID) | ORAL | 0 refills | Status: DC

## 2016-03-18 MED ORDER — CEFTRIAXONE SODIUM 250 MG IJ SOLR
250.0000 mg | Freq: Once | INTRAMUSCULAR | Status: AC
  Administered 2016-03-18: 250 mg via INTRAMUSCULAR
  Filled 2016-03-18: qty 250

## 2016-03-18 MED ORDER — DICLOFENAC SODIUM ER 100 MG PO TB24: 100.0000 mg | ORAL_TABLET | Freq: Every day | ORAL | 0 refills | Status: DC

## 2016-03-18 MED ORDER — FENTANYL CITRATE (PF) 100 MCG/2ML IJ SOLN
100.0000 ug | Freq: Once | INTRAMUSCULAR | Status: AC
  Administered 2016-03-18: 100 ug via INTRAVENOUS
  Filled 2016-03-18: qty 2

## 2016-03-18 MED ORDER — LIDOCAINE HCL (PF) 1 % IJ SOLN
INTRAMUSCULAR | Status: AC
  Administered 2016-03-18: 5 mL
  Filled 2016-03-18: qty 5

## 2016-03-18 MED ORDER — IOPAMIDOL (ISOVUE-300) INJECTION 61%
100.0000 mL | Freq: Once | INTRAVENOUS | Status: AC | PRN
  Administered 2016-03-18: 100 mL via INTRAVENOUS

## 2016-03-18 MED ORDER — KETOROLAC TROMETHAMINE 30 MG/ML IJ SOLN
30.0000 mg | Freq: Once | INTRAMUSCULAR | Status: AC
  Administered 2016-03-18: 30 mg via INTRAVENOUS
  Filled 2016-03-18: qty 1

## 2016-03-18 MED ORDER — AZITHROMYCIN 1 G PO PACK
1.0000 g | PACK | Freq: Once | ORAL | Status: AC
  Administered 2016-03-18: 1 g via ORAL
  Filled 2016-03-18: qty 1

## 2016-03-18 MED ORDER — ONDANSETRON HCL 4 MG/2ML IJ SOLN
4.0000 mg | Freq: Once | INTRAMUSCULAR | Status: AC
  Administered 2016-03-18: 4 mg via INTRAVENOUS
  Filled 2016-03-18: qty 2

## 2016-03-18 NOTE — ED Notes (Signed)
MD at bedside. 

## 2016-03-18 NOTE — ED Notes (Signed)
Patient transported to X-ray 

## 2016-03-18 NOTE — ED Provider Notes (Signed)
[facility] Provider Note   CSN: [id number] Arrival date & time: 03/18/16  0117     History   Chief Complaint Chief Complaint  Patient presents with  . Abdominal Pain    HPI Whitney [name] is a 30 y.o. female.  The history is provided by the patient.  Abdominal Pain   This is a new problem. The current episode started yesterday. The problem occurs constantly. The problem has not changed since onset.The pain is associated with an unknown factor. The pain is located in the RUQ, RLQ and epigastric region. The pain is severe. Associated symptoms include nausea. Pertinent negatives include fever, diarrhea, vomiting, constipation, dysuria, hematuria and myalgias. Nothing aggravates the symptoms. Nothing relieves the symptoms. Past workup does not include GI consult. Her past medical history does not include PUD.  Started in B low back then came to RUQ and R flank.    Past Medical History:  Diagnosis Date  . Bronchitis     There are no active problems to display for this patient.   Past Surgical History:  Procedure Laterality Date  . ESSURE TUBAL LIGATION    . TUBAL LIGATION      OB History    No data available       Home Medications    Prior to Admission medications   Medication Sig Start Date End Date Taking? Authorizing Provider  albuterol (PROVENTIL HFA;VENTOLIN HFA) 108 (90 BASE) MCG/ACT inhaler Inhale 2 puffs into the lungs every 4 (four) hours as needed for wheezing or shortness of breath. 07/10/14   Doug Sou, MD  chlorpheniramine-HYDROcodone (TUSSIONEX PENNKINETIC ER) 10-8 MG/5ML LQCR Take 5 mLs by mouth every 12 (twelve) hours as needed for cough. 06/19/13   John Molpus, MD  predniSONE (DELTASONE) 10 MG tablet Take 4 tablets (40 mg total) by mouth daily. 07/11/14   Doug Sou, MD  Spacer/Aero-Holding Chambers (AEROCHAMBER PLUS WITH MASK) inhaler Use as instructed 07/10/14   Doug Sou, MD    Family History No family history on  file.  Social History Social History  Substance Use Topics  . Smoking status: Current Every Day Smoker    Packs/day: 0.50    Types: Cigarettes  . Smokeless tobacco: Never Used  . Alcohol use No     Allergies   Percocet [oxycodone-acetaminophen]   Review of Systems Review of Systems  Constitutional: Negative for fever.  Gastrointestinal: Positive for abdominal pain and nausea. Negative for constipation, diarrhea and vomiting.  Genitourinary: Negative for dysuria, genital sores, hematuria, vaginal bleeding and vaginal discharge.  Musculoskeletal: Negative for myalgias.  All other systems reviewed and are negative.    Physical Exam Updated Vital Signs BP 122/75 (BP Location: Left Arm)   Pulse 81   Temp 98.4 F (36.9 C) (Oral)   Resp 16   Ht 5\' 1"  (1.549 m)   Wt 103 lb (46.7 kg)   LMP 03/12/2016 (Exact Date)   SpO2 100%   BMI 19.46 kg/m   Physical Exam  Constitutional: She is oriented to person, place, and time. She appears well-developed and well-nourished. No distress.  HENT:  Head: Normocephalic and atraumatic.  Nose: Nose normal.  Mouth/Throat: No oropharyngeal exudate.  Eyes: Conjunctivae are normal. Pupils are equal, round, and reactive to light.  Neck: Normal range of motion. Neck supple.  Cardiovascular: Normal rate, regular rhythm and intact distal pulses.   Pulmonary/Chest: Effort normal and breath sounds normal. No respiratory distress.  Abdominal: Soft. Bowel sounds are normal. There is generalized tenderness. There  is no rigidity, no rebound and no guarding. No hernia.  Genitourinary: Cervix exhibits motion tenderness. There is tenderness in the vagina. Vaginal discharge found.  Genitourinary Comments: Copious thick green discharge, chaperone present  Musculoskeletal: Normal range of motion.  Neurological: She is alert and oriented to person, place, and time. She has normal reflexes.  Skin: Capillary refill takes less than 2 seconds.  Psychiatric: She  has a normal mood and affect.     ED Treatments / Results  Labs (all labs ordered are listed, but only abnormal results are displayed) Labs Reviewed  URINALYSIS, ROUTINE W REFLEX MICROSCOPIC (NOT AT [facility]) - Abnormal; Notable for the following:       Result Value   Hgb urine dipstick TRACE (*)    All other components within normal limits  URINE MICROSCOPIC-ADD ON - Abnormal; Notable for the following:    Squamous Epithelial / LPF 0-5 (*)    Bacteria, UA RARE (*)    All other components within normal limits  CBC WITH DIFFERENTIAL/PLATELET - Abnormal; Notable for the following:    WBC 16.6 (*)    Hemoglobin 11.5 (*)    HCT 34.8 (*)    RDW 15.8 (*)    Neutro Abs 14.2 (*)    Monocytes Absolute 1.3 (*)    All other components within normal limits  PREGNANCY, URINE  COMPREHENSIVE METABOLIC PANEL   Results for orders placed or performed during the hospital encounter of 03/18/16  Wet prep, genital  Result Value Ref Range   Yeast Wet Prep HPF POC NONE SEEN NONE SEEN   Trich, Wet Prep NONE SEEN NONE SEEN   Clue Cells Wet Prep HPF POC PRESENT (A) NONE SEEN   WBC, Wet Prep HPF POC MANY (A) NONE SEEN   Sperm NONE SEEN   Urinalysis, Routine w reflex microscopic  Result Value Ref Range   Color, Urine YELLOW YELLOW   APPearance CLEAR CLEAR   Specific Gravity, Urine 1.009 1.005 - 1.030   pH 6.0 5.0 - 8.0   Glucose, UA NEGATIVE NEGATIVE mg/dL   Hgb urine dipstick TRACE (A) NEGATIVE   Bilirubin Urine NEGATIVE NEGATIVE   Ketones, ur NEGATIVE NEGATIVE mg/dL   Protein, ur NEGATIVE NEGATIVE mg/dL   Nitrite NEGATIVE NEGATIVE   Leukocytes, UA NEGATIVE NEGATIVE  Pregnancy, urine  Result Value Ref Range   Preg Test, Ur NEGATIVE NEGATIVE  Urine microscopic-add on  Result Value Ref Range   Squamous Epithelial / LPF 0-5 (A) NONE SEEN   WBC, UA 0-5 0 - 5 WBC/hpf   RBC / HPF 0-5 0 - 5 RBC/hpf   Bacteria, UA RARE (A) NONE SEEN  CBC with Differential/Platelet  Result Value Ref Range   WBC  16.6 (H) 4.0 - 10.5 K/uL   RBC 4.05 3.87 - 5.11 MIL/uL   Hemoglobin 11.5 (L) 12.0 - 15.0 g/dL   HCT 40.9 (L) 81.1 - 91.4 %   MCV 85.9 78.0 - 100.0 fL   MCH 28.4 26.0 - 34.0 pg   MCHC 33.0 30.0 - 36.0 g/dL   RDW 78.2 (H) 95.6 - 21.3 %   Platelets 234 150 - 400 K/uL   Neutrophils Relative % 85 %   Neutro Abs 14.2 (H) 1.7 - 7.7 K/uL   Lymphocytes Relative 6 %   Lymphs Abs 1.0 0.7 - 4.0 K/uL   Monocytes Relative 8 %   Monocytes Absolute 1.3 (H) 0.1 - 1.0 K/uL   Eosinophils Relative 1 %   Eosinophils Absolute 0.1 0.0 -  0.7 K/uL   Basophils Relative 0 %   Basophils Absolute 0.0 0.0 - 0.1 K/uL  Comprehensive metabolic panel  Result Value Ref Range   Sodium 138 135 - 145 mmol/L   Potassium 3.0 (L) 3.5 - 5.1 mmol/L   Chloride 106 101 - 111 mmol/L   CO2 24 22 - 32 mmol/L   Glucose, Bld 90 65 - 99 mg/dL   BUN 7 6 - 20 mg/dL   Creatinine, Ser 1.610.54 0.44 - 1.00 mg/dL   Calcium 9.2 8.9 - 09.610.3 mg/dL   Total Protein 7.4 6.5 - 8.1 g/dL   Albumin 4.6 3.5 - 5.0 g/dL   AST 15 15 - 41 U/L   ALT 11 (L) 14 - 54 U/L   Alkaline Phosphatase 72 38 - 126 U/L   Total Bilirubin 0.7 0.3 - 1.2 mg/dL   GFR calc non Af Amer >60 >60 mL/min   GFR calc Af Amer >60 >60 mL/min   Anion gap 8 5 - 15   Ct Abdomen Pelvis W Contrast  Result Date: 03/18/2016 CLINICAL DATA:  Right lower quadrant pain EXAM: CT ABDOMEN AND PELVIS WITH CONTRAST TECHNIQUE: Multidetector CT imaging of the abdomen and pelvis was performed using the standard protocol following bolus administration of intravenous contrast. CONTRAST:  100mL ISOVUE-300 IOPAMIDOL (ISOVUE-300) INJECTION 61% COMPARISON:  None. FINDINGS: Lower chest: No pulmonary nodules or pleural effusion. No visible pericardial effusion. Hepatobiliary: No focal liver lesions. No biliary dilatation. The gallbladder is normal. Pancreas: Pancreatic contours are normal. No abnormal calcifications or mass lesions. No peripancreatic fluid collection. No pancreatic ductal dilatation.  Spleen: Normal Adrenals/Urinary Tract: Adrenal glands are normal. No hydronephrosis or solid renal mass. No evidence of ureteral obstruction. The urinary bladder is normal. Stomach/Bowel: No dilated small bowel or other evidence of obstruction. No enteric or colonic inflammation. The appendix is normal. No abdominal fluid collection. Vascular/Lymphatic: The inferior vena cava, portal vein, splenic vein and superior mesenteric vein are patent. The aorta, bilateral renal arteries, celiac axis and superior mesenteric artery are patent. The visualized iliac vessels are normal. No retroperitoneal, mesenteric or pelvic adenopathy. Reproductive: There is hyperdense material within the endometrial canal, possibly representing products of menstruation. The ovaries are normal. There are multiple prominent bilateral pelvic vessels. Trace fluid in the pelvis is likely physiologic. Other: The visualized extraperitoneal and extrathoracic soft tissues are normal. Musculoskeletal: Sclerotic focus within the right sacral ala is likely a bone island. No other lytic or blastic foci. IMPRESSION: 1. No acute process identified within the abdomen or pelvis. 2. Normal appendix. 3. Hyperdense material within the endometrial canal likely reflects products of menstruation. Correlate clinically for abnormal vaginal bleeding. Electronically Signed   By: Deatra RobinsonKevin  Herman M.D.   On: 03/18/2016 04:37     EKG  EKG Interpretation None       Radiology No results found.  Procedures Procedures (including critical care time)  Medications Ordered in ED Medications  ondansetron (ZOFRAN) injection 4 mg (4 mg Intravenous Given 03/18/16 0212)  fentaNYL (SUBLIMAZE) injection 100 mcg (100 mcg Intravenous Given 03/18/16 0212)     Initial Impression / Assessment and Plan / ED Course  I have reviewed the triage vital signs and the nursing notes.  Pertinent labs & imaging results that were available during my care of the patient were  reviewed by me and considered in my medical decision making (see chart for details).  Clinical Course    Vitals:   03/18/16 0128  BP: 122/75  Pulse: 81  Resp:  16  Temp: 98.4 F (36.9 C)   Results for orders placed or performed during the hospital encounter of 03/18/16  Urinalysis, Routine w reflex microscopic  Result Value Ref Range   Color, Urine YELLOW YELLOW   APPearance CLEAR CLEAR   Specific Gravity, Urine 1.009 1.005 - 1.030   pH 6.0 5.0 - 8.0   Glucose, UA NEGATIVE NEGATIVE mg/dL   Hgb urine dipstick TRACE (A) NEGATIVE   Bilirubin Urine NEGATIVE NEGATIVE   Ketones, ur NEGATIVE NEGATIVE mg/dL   Protein, ur NEGATIVE NEGATIVE mg/dL   Nitrite NEGATIVE NEGATIVE   Leukocytes, UA NEGATIVE NEGATIVE  Pregnancy, urine  Result Value Ref Range   Preg Test, Ur NEGATIVE NEGATIVE  Urine microscopic-add on  Result Value Ref Range   Squamous Epithelial / LPF 0-5 (A) NONE SEEN   WBC, UA 0-5 0 - 5 WBC/hpf   RBC / HPF 0-5 0 - 5 RBC/hpf   Bacteria, UA RARE (A) NONE SEEN  CBC with Differential/Platelet  Result Value Ref Range   WBC 16.6 (H) 4.0 - 10.5 K/uL   RBC 4.05 3.87 - 5.11 MIL/uL   Hemoglobin 11.5 (L) 12.0 - 15.0 g/dL   HCT 16.134.8 (L) 09.636.0 - 04.546.0 %   MCV 85.9 78.0 - 100.0 fL   MCH 28.4 26.0 - 34.0 pg   MCHC 33.0 30.0 - 36.0 g/dL   RDW 40.915.8 (H) 81.111.5 - 91.415.5 %   Platelets 234 150 - 400 K/uL   Neutrophils Relative % 85 %   Neutro Abs 14.2 (H) 1.7 - 7.7 K/uL   Lymphocytes Relative 6 %   Lymphs Abs 1.0 0.7 - 4.0 K/uL   Monocytes Relative 8 %   Monocytes Absolute 1.3 (H) 0.1 - 1.0 K/uL   Eosinophils Relative 1 %   Eosinophils Absolute 0.1 0.0 - 0.7 K/uL   Basophils Relative 0 %   Basophils Absolute 0.0 0.0 - 0.1 K/uL   No results found.  Medications  cefTRIAXone (ROCEPHIN) injection 250 mg (not administered)  azithromycin (ZITHROMAX) powder 1 g (not administered)  ondansetron (ZOFRAN) injection 4 mg (4 mg Intravenous Given 03/18/16 0212)  fentaNYL (SUBLIMAZE) injection  100 mcg (100 mcg Intravenous Given 03/18/16 0212)  iopamidol (ISOVUE-300) 61 % injection 100 mL (100 mLs Intravenous Contrast Given 03/18/16 0401)  ketorolac (TORADOL) 30 MG/ML injection 30 mg (30 mg Intravenous Given 03/18/16 0456)    Final Clinical Impressions(s) / ED Diagnoses   Final diagnoses:  None    New Prescriptions New Prescriptions   No medications on file   Patient reported immediate relief of pain following gus of discharge during the pelvic.  Copious mucopurulent discharge eminated from os.  Will treat for PID and have patient follow up with her GYN.   No sexual activity of any kind until 7 days after all partners treated.     Cy BlamerApril Elberta Lachapelle, MD 03/18/16 [phone]

## 2016-03-18 NOTE — ED Triage Notes (Signed)
RLQ pain since yesterday morning.  Pain started in back and then went to RLQ.  Nausea but no V/D.  Unable to eat. Increase pain with movement of any kind.

## 2016-03-20 LAB — GC/CHLAMYDIA PROBE AMP (~~LOC~~) NOT AT ARMC
Chlamydia: POSITIVE — AB
NEISSERIA GONORRHEA: POSITIVE — AB

## 2016-03-21 ENCOUNTER — Telehealth (HOSPITAL_COMMUNITY): Payer: Self-pay

## 2016-03-21 NOTE — Telephone Encounter (Addendum)
[facility] received [facility].  Treated with Zithromax and Rocephin.  DHHS form completed and faxed.   03/21/2016 @ 10:09 LVM requesting callback.  03/21/2016 @ 10:16  ID verified.  Pt informed of dx, tx rcvd approp., notify partner(s) for testing and tx., and abstain from sex x 10 days from tx date.

## 2017-09-09 ENCOUNTER — Emergency Department (HOSPITAL_BASED_OUTPATIENT_CLINIC_OR_DEPARTMENT_OTHER)
Admission: EM | Admit: 2017-09-09 | Discharge: 2017-09-09 | Disposition: A | Discharge status: Home / Self Care | Payer: Managed Care, Other (non HMO) | Attending: Emergency Medicine | Admitting: Emergency Medicine

## 2017-09-09 ENCOUNTER — Encounter (HOSPITAL_BASED_OUTPATIENT_CLINIC_OR_DEPARTMENT_OTHER): Payer: Self-pay | Admitting: *Deleted

## 2017-09-09 DIAGNOSIS — R51 Headache: Secondary | ICD-10-CM | POA: Insufficient documentation

## 2017-09-09 DIAGNOSIS — M6281 Muscle weakness (generalized): Secondary | ICD-10-CM | POA: Insufficient documentation

## 2017-09-09 DIAGNOSIS — Z79899 Other long term (current) drug therapy: Secondary | ICD-10-CM | POA: Insufficient documentation

## 2017-09-09 DIAGNOSIS — F17210 Nicotine dependence, cigarettes, uncomplicated: Secondary | ICD-10-CM | POA: Insufficient documentation

## 2017-09-09 DIAGNOSIS — R531 Weakness: Secondary | ICD-10-CM

## 2017-09-09 DIAGNOSIS — E860 Dehydration: Secondary | ICD-10-CM

## 2017-09-09 LAB — CBC WITH DIFFERENTIAL/PLATELET
BASOS ABS: 0 10*3/uL (ref 0.0–0.1)
BASOS PCT: 0 %
EOS PCT: 1 %
Eosinophils Absolute: 0.1 10*3/uL (ref 0.0–0.7)
HCT: 37.8 % (ref 36.0–46.0)
Hemoglobin: 12.6 g/dL (ref 12.0–15.0)
Lymphocytes Relative: 24 %
Lymphs Abs: 1.9 10*3/uL (ref 0.7–4.0)
MCH: 31.8 pg (ref 26.0–34.0)
MCHC: 33.3 g/dL (ref 30.0–36.0)
MCV: 95.5 fL (ref 78.0–100.0)
MONO ABS: 0.5 10*3/uL (ref 0.1–1.0)
Monocytes Relative: 6 %
Neutro Abs: 5.4 10*3/uL (ref 1.7–7.7)
Neutrophils Relative %: 69 %
PLATELETS: 303 10*3/uL (ref 150–400)
RBC: 3.96 MIL/uL (ref 3.87–5.11)
RDW: 14.5 % (ref 11.5–15.5)
WBC: 7.8 10*3/uL (ref 4.0–10.5)

## 2017-09-09 LAB — COMPREHENSIVE METABOLIC PANEL
ALBUMIN: 4.4 g/dL (ref 3.5–5.0)
ALT: 12 U/L — ABNORMAL LOW (ref 14–54)
AST: 17 U/L (ref 15–41)
Alkaline Phosphatase: 76 U/L (ref 38–126)
Anion gap: 9 (ref 5–15)
BUN: 7 mg/dL (ref 6–20)
CHLORIDE: 107 mmol/L (ref 101–111)
CO2: 24 mmol/L (ref 22–32)
Calcium: 9.2 mg/dL (ref 8.9–10.3)
Creatinine, Ser: 0.49 mg/dL (ref 0.44–1.00)
GFR calc Af Amer: >60 mL/min (ref >60)
GLUCOSE: 88 mg/dL (ref 65–99)
POTASSIUM: 3.5 mmol/L (ref 3.5–5.1)
SODIUM: 140 mmol/L (ref 135–145)
Total Bilirubin: 0.4 mg/dL (ref 0.3–1.2)
Total Protein: 7.6 g/dL (ref 6.5–8.1)

## 2017-09-09 MED ORDER — SODIUM CHLORIDE 0.9 % IV BOLUS (SEPSIS)
2000.0000 mL | Freq: Once | INTRAVENOUS | Status: AC
  Administered 2017-09-09: 2000 mL via INTRAVENOUS

## 2017-09-09 MED ORDER — ACETAMINOPHEN 500 MG PO TABS
1000.0000 mg | ORAL_TABLET | Freq: Once | ORAL | Status: AC
  Administered 2017-09-09: 1000 mg via ORAL
  Filled 2017-09-09: qty 2

## 2017-09-09 MED ORDER — IBUPROFEN 400 MG PO TABS
400.0000 mg | ORAL_TABLET | Freq: Once | ORAL | Status: AC
  Administered 2017-09-09: 400 mg via ORAL
  Filled 2017-09-09: qty 1

## 2017-09-09 NOTE — ED Provider Notes (Signed)
[facility] EMERGENCY DEPARTMENT Provider Note   CSN: [id number] Arrival date & time: 09/09/17  1119     History   Chief Complaint Chief Complaint  Patient presents with  . Dizziness    HPI Whitney [name] is a 32 y.o. female.  Patient developed dizziness meaning lightheadedness onset yesterday accompanied by mild headache and neck pain.  No neck stiffness no fever.  No treatment prior to coming here.  She also thinks she may have been bitten by an insect on her right thigh yesterday where a red mark was noted, and area was painful.  The redness and pain at her right thigh has since much improved, without treatment.  No other associated symptoms.  She denies abdominal pain.  Denies rash.  Denies vertigo.  Lightheadedness is worse with standing and improved with lying  down.  No chest pain no abdominal pain.  She does admit to diminished appetite no other associated symptoms.  HPI  Past Medical History:  Diagnosis Date  . Bronchitis     There are no active problems to display for this patient.   Past Surgical History:  Procedure Laterality Date  . ESSURE TUBAL LIGATION    . TUBAL LIGATION      OB History    No data available       Home Medications    Prior to Admission medications   Medication Sig Start Date End Date Taking? Authorizing Provider  albuterol (PROVENTIL HFA;VENTOLIN HFA) 108 (90 BASE) MCG/ACT inhaler Inhale 2 puffs into the lungs every 4 (four) hours as needed for wheezing or shortness of breath. 07/10/14   Doug SouJacubowitz, Jaasiel [place], MD  chlorpheniramine-HYDROcodone (TUSSIONEX PENNKINETIC ER) 10-8 MG/5ML LQCR Take 5 mLs by mouth every 12 (twelve) hours as needed for cough. 06/19/13   Molpus, John, MD  Diclofenac Sodium CR (VOLTAREN-XR) 100 MG 24 hr tablet Take 1 tablet (100 mg total) by mouth daily. 03/18/16   Palumbo, April, MD  doxycycline (VIBRAMYCIN) 100 MG capsule Take 1 capsule (100 mg total) by mouth 2 (two) times daily. One po bid 03/18/16   Palumbo,  April, MD  predniSONE (DELTASONE) 10 MG tablet Take 4 tablets (40 mg total) by mouth daily. 07/11/14   Doug SouJacubowitz, Tanisha Lutes, MD  Spacer/Aero-Holding Chambers (AEROCHAMBER PLUS WITH MASK) inhaler Use as instructed 07/10/14   Doug SouJacubowitz, Sherina Stammer, MD    Family History History reviewed. No pertinent family history.  Social History Social History   Tobacco Use  . Smoking status: Current Every Day Smoker    Packs/day: 0.50    Types: Cigarettes  . Smokeless tobacco: Never Used  Substance Use Topics  . Alcohol use: No  . Drug use: No     Allergies   Percocet [oxycodone-acetaminophen]   Review of Systems Review of Systems  Constitutional: Positive for appetite change.  HENT: Negative.   Respiratory: Negative.   Cardiovascular: Negative.   Gastrointestinal: Negative.   Musculoskeletal: Positive for neck pain.  Skin: Negative.   Neurological: Positive for light-headedness and headaches.  Psychiatric/Behavioral: Negative.   All other systems reviewed and are negative.    Physical Exam Updated Vital Signs BP 112/89 (BP Location: Right Arm)   Pulse 72   Temp 98.6 F (37 C) (Oral)   Resp 18   SpO2 99%   Physical Exam  Constitutional: She appears well-developed and well-nourished.  HENT:  Head: Normocephalic and atraumatic.  Eyes: Conjunctivae are normal. Pupils are equal, round, and reactive to light.  Neck: Neck supple. No tracheal deviation present.  No thyromegaly present.  Cardiovascular: Normal rate and regular rhythm.  No murmur heard. Pulmonary/Chest: Effort normal and breath sounds normal.  Abdominal: Soft. Bowel sounds are normal. She exhibits no distension. There is no tenderness.  Musculoskeletal: Normal range of motion. She exhibits no edema or tenderness.  Neurological: She is alert. Coordination normal.  Skin: Skin is warm and dry. Capillary refill takes less than 2 seconds. No rash noted.  No lesion.  On right thigh or elsewhere visualized  Psychiatric: She has a  normal mood and affect. Her behavior is normal.  Nursing note and vitals reviewed.    ED Treatments / Results  Labs (all labs ordered are listed, but only abnormal results are displayed) Labs Reviewed  CBC WITH DIFFERENTIAL/PLATELET  COMPREHENSIVE METABOLIC PANEL    EKG  EKG Interpretation None      Results for orders placed or performed during the hospital encounter of 09/09/17  CBC with Differential/Platelet  Result Value Ref Range   WBC 7.8 4.0 - 10.5 K/uL   RBC 3.96 3.87 - 5.11 MIL/uL   Hemoglobin 12.6 12.0 - 15.0 g/dL   HCT 16.1 09.6 - 04.5 %   MCV 95.5 78.0 - 100.0 fL   MCH 31.8 26.0 - 34.0 pg   MCHC 33.3 30.0 - 36.0 g/dL   RDW 40.9 81.1 - 91.4 %   Platelets 303 150 - 400 K/uL   Neutrophils Relative % 69 %   Neutro Abs 5.4 1.7 - 7.7 K/uL   Lymphocytes Relative 24 %   Lymphs Abs 1.9 0.7 - 4.0 K/uL   Monocytes Relative 6 %   Monocytes Absolute 0.5 0.1 - 1.0 K/uL   Eosinophils Relative 1 %   Eosinophils Absolute 0.1 0.0 - 0.7 K/uL   Basophils Relative 0 %   Basophils Absolute 0.0 0.0 - 0.1 K/uL  Comprehensive metabolic panel  Result Value Ref Range   Sodium 140 135 - 145 mmol/L   Potassium 3.5 3.5 - 5.1 mmol/L   Chloride 107 101 - 111 mmol/L   CO2 24 22 - 32 mmol/L   Glucose, Bld 88 65 - 99 mg/dL   BUN 7 6 - 20 mg/dL   Creatinine, Ser 7.82 0.44 - 1.00 mg/dL   Calcium 9.2 8.9 - 95.6 mg/dL   Total Protein 7.6 6.5 - 8.1 g/dL   Albumin 4.4 3.5 - 5.0 g/dL   AST 17 15 - 41 U/L   ALT 12 (L) 14 - 54 U/L   Alkaline Phosphatase 76 38 - 126 U/L   Total Bilirubin 0.4 0.3 - 1.2 mg/dL   GFR calc non Af Amer >60 >60 mL/min   GFR calc Af Amer >60 >60 mL/min   Anion gap 9 5 - 15   No results found. Radiology No results found.  Procedures Procedures (including critical care time)  Medications Ordered in ED Medications  sodium chloride 0.9 % bolus 2,000 mL (2,000 mLs Intravenous New Bag/Given 09/09/17 1330)  acetaminophen (TYLENOL) tablet 1,000 mg (1,000 mg Oral  Given 09/09/17 1329)    2:50 PM she is no longer lightheaded on standing after treatment with intravenous fluids.  She states headache has not changed after treatment with Tylenol.  Ibuprofen ordered.  She feels well enough to go home. Assessment/medical decision making: There are no signs of meningitis.  Patient is not acutely ill-appearing.  Lab work is reassuring I suspect that she has viral illness and is mildly dehydrated.  Plan Tylenol or Advil for pain.  Encourage oral hydration.  See PMD if not feeling better in 2 or 3 days.  Return precautions given Initial Impression / Assessment and Plan / ED Course  I have reviewed the triage vital signs and the nursing notes.  Pertinent labs & imaging results that were available during my care of the patient were reviewed by me and considered in my medical decision making (see chart for details).       Final Clinical Impressions(s) / ED Diagnoses  Diagnosis #1 generalized weakness #2 headache #3 dehydration Final diagnoses:  None    ED Discharge Orders    None       Doug Sou, MD 09/09/17 [phone]

## 2017-09-09 NOTE — Discharge Instructions (Signed)
We suspect that you have a viral illness.  Lab work today was normal.  take Tylenol or Advil for aches as directed.Make sure that you drink at least six 8 ounce glasses of water or Gatorade each day in order to stay well-hydrated.  See Dr. Cliffton AstersWhite if not feeling better in 2 or 3 days.  Return if you feel worse for any reason.

## 2017-09-09 NOTE — ED Triage Notes (Signed)
Pt c/o dizziness x 1 day also c/o insect bite to right thigh x 1 day ago

## 2018-01-23 ENCOUNTER — Encounter (HOSPITAL_COMMUNITY): Payer: Self-pay | Admitting: Emergency Medicine

## 2018-01-23 ENCOUNTER — Other Ambulatory Visit: Payer: Self-pay

## 2018-01-23 ENCOUNTER — Emergency Department (HOSPITAL_COMMUNITY)
Admission: EM | Admit: 2018-01-23 | Discharge: 2018-01-23 | Disposition: A | Discharge status: Home / Self Care | Payer: Managed Care, Other (non HMO) | Attending: Emergency Medicine | Admitting: Emergency Medicine

## 2018-01-23 DIAGNOSIS — F17210 Nicotine dependence, cigarettes, uncomplicated: Secondary | ICD-10-CM | POA: Insufficient documentation

## 2018-01-23 DIAGNOSIS — K0889 Other specified disorders of teeth and supporting structures: Secondary | ICD-10-CM

## 2018-01-23 DIAGNOSIS — R55 Syncope and collapse: Secondary | ICD-10-CM | POA: Insufficient documentation

## 2018-01-23 DIAGNOSIS — F10988 Alcohol use, unspecified with other alcohol-induced disorder: Secondary | ICD-10-CM | POA: Insufficient documentation

## 2018-01-23 DIAGNOSIS — F10929 Alcohol use, unspecified with intoxication, unspecified: Secondary | ICD-10-CM

## 2018-01-23 LAB — URINALYSIS, ROUTINE W REFLEX MICROSCOPIC
Bilirubin Urine: NEGATIVE
Glucose, UA: NEGATIVE mg/dL
Hgb urine dipstick: NEGATIVE
KETONES UR: NEGATIVE mg/dL
LEUKOCYTES UA: NEGATIVE
NITRITE: NEGATIVE
PH: 5 (ref 5.0–8.0)
Protein, ur: NEGATIVE mg/dL
Specific Gravity, Urine: 1.018 (ref 1.005–1.030)

## 2018-01-23 LAB — BASIC METABOLIC PANEL
ANION GAP: 8 (ref 5–15)
BUN: 7 mg/dL (ref 6–20)
CHLORIDE: 108 mmol/L (ref 98–111)
CO2: 23 mmol/L (ref 22–32)
Calcium: 8.4 mg/dL — ABNORMAL LOW (ref 8.9–10.3)
Creatinine, Ser: 0.56 mg/dL (ref 0.44–1.00)
GFR calc non Af Amer: >60 mL/min (ref >60)
Glucose, Bld: 89 mg/dL (ref 70–99)
Potassium: 3.6 mmol/L (ref 3.5–5.1)
Sodium: 139 mmol/L (ref 135–145)

## 2018-01-23 LAB — CBC WITH DIFFERENTIAL/PLATELET
BASOS PCT: 1 %
Basophils Absolute: 0 10*3/uL (ref 0.0–0.1)
Eosinophils Absolute: 0.1 10*3/uL (ref 0.0–0.7)
Eosinophils Relative: 2 %
HEMATOCRIT: 38.5 % (ref 36.0–46.0)
HEMOGLOBIN: 12.7 g/dL (ref 12.0–15.0)
LYMPHS ABS: 1.6 10*3/uL (ref 0.7–4.0)
Lymphocytes Relative: 23 %
MCH: 31.7 pg (ref 26.0–34.0)
MCHC: 33 g/dL (ref 30.0–36.0)
MCV: 96 fL (ref 78.0–100.0)
MONOS PCT: 7 %
Monocytes Absolute: 0.5 10*3/uL (ref 0.1–1.0)
NEUTROS ABS: 4.8 10*3/uL (ref 1.7–7.7)
NEUTROS PCT: 67 %
Platelets: 196 10*3/uL (ref 150–400)
RBC: 4.01 MIL/uL (ref 3.87–5.11)
RDW: 14.2 % (ref 11.5–15.5)
WBC: 7 10*3/uL (ref 4.0–10.5)

## 2018-01-23 LAB — I-STAT BETA HCG BLOOD, ED (MC, WL, AP ONLY): I-stat hCG, quantitative: <5 m[IU]/mL (ref <5)

## 2018-01-23 LAB — ETHANOL: Alcohol, Ethyl (B): 161 mg/dL — ABNORMAL HIGH (ref <10)

## 2018-01-23 LAB — CBG MONITORING, ED: Glucose-Capillary: 96 mg/dL (ref 70–99)

## 2018-01-23 MED ORDER — AMOXICILLIN 250 MG PO CAPS
500.0000 mg | ORAL_CAPSULE | Freq: Once | ORAL | Status: AC
  Administered 2018-01-23: 500 mg via ORAL
  Filled 2018-01-23: qty 2

## 2018-01-23 MED ORDER — NAPROXEN 500 MG PO TABS: 500.0000 mg | ORAL_TABLET | Freq: Two times a day (BID) | ORAL | 0 refills | Status: DC

## 2018-01-23 MED ORDER — AMOXICILLIN 500 MG PO CAPS: 500.0000 mg | ORAL_CAPSULE | Freq: Three times a day (TID) | ORAL | 0 refills | Status: AC

## 2018-01-23 MED ORDER — IBUPROFEN 800 MG PO TABS
800.0000 mg | ORAL_TABLET | Freq: Once | ORAL | Status: AC
  Administered 2018-01-23: 800 mg via ORAL
  Filled 2018-01-23: qty 1

## 2018-01-23 NOTE — Discharge Instructions (Addendum)
As discussed, I suspect you passed out today because you are intoxicated.  You also may have a dental infection which you are being prescribed antibiotics for.  Please refer to the referral lists attached including dental referrals and assistance if you think you have a problem with drinking.  Do not drive a vehicle until you are sober.  The antibiotics should greatly help reduce your dental pain over the next 48 hours.  You may try the pain medicine prescribed if needed.

## 2018-01-23 NOTE — ED Provider Notes (Signed)
[facility] EMERGENCY DEPARTMENT Provider Note   CSN: [id number] Arrival date & time: 01/23/18  1055     History   Chief Complaint Chief Complaint  Patient presents with  . Dental Pain    HPI Whitney [name] is a 32 y.o. female presenting with a 4 day history of dental pain which culminated in nausea with emesis and syncope this am.  She describes having severe pain at her left upper gumline with swelling and suspicion her wisdom tooth may be trying to erupt.  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  When she walked into the [facility] started feeling lightheaded, nauseous, her vision blurred and she felt she was going to vomit.  She ran out of the church during which time she felt sob, had emesis x 2, then had a brief episode of syncope, landing on grass.  She had urinary incontinence during the event.  Her client witnessed the event and denies any seizure like activity.  Pt denies shortness of breath before or after the event, currently just has persistent dental pain and feels tired. Denies headache, denies focal weakness, no complaints of pain in neck, back or extremities. She has had no treatment prior to arrival.  The history is provided by the patient.    Past Medical History:  Diagnosis Date  . Bronchitis     There are no active problems to display for this patient.   Past Surgical History:  Procedure Laterality Date  . ESSURE TUBAL LIGATION    . TUBAL LIGATION       OB History   None      Home Medications    Prior to Admission medications   Medication Sig Start Date End Date Taking? Authorizing Provider  amoxicillin (AMOXIL) 500 MG capsule Take 1 capsule (500 mg total) by mouth 3 (three) times daily for 10 days. 01/23/18 02/02/18  Burgess AmorIdol, Adie Vilar, PA-C  naproxen (NAPROSYN) 500 MG tablet Take 1 tablet (500 mg total) by mouth 2 (two) times daily. 01/23/18   Burgess AmorIdol, Breanna Shorkey, PA-C    Family History No family history on file.  Social  History Social History   Tobacco Use  . Smoking status: Current Every Day Smoker    Packs/day: 0.50    Types: Cigarettes  . Smokeless tobacco: Never Used  Substance Use Topics  . Alcohol use: No  . Drug use: No     Allergies   Percocet [oxycodone-acetaminophen]   Review of Systems Review of Systems  Constitutional: Positive for fatigue. Negative for chills and fever.  HENT: Positive for dental problem. Negative for congestion, facial swelling, sinus pressure, sinus pain and sore throat.   Eyes: Negative.   Respiratory: Negative for cough, chest tightness, shortness of breath, wheezing and stridor.   Cardiovascular: Negative for chest pain, palpitations and leg swelling.  Gastrointestinal: Positive for nausea and vomiting. Negative for abdominal pain.  Genitourinary: Negative.   Musculoskeletal: Negative for arthralgias, joint swelling and neck pain.  Skin: Negative.  Negative for rash and wound.  Neurological: Positive for syncope. Negative for dizziness, weakness, light-headedness, numbness and headaches.  Psychiatric/Behavioral: Negative.      Physical Exam Updated Vital Signs BP 120/78 (BP Location: Right Arm)   Pulse 74   Temp 99.6 F (37.6 C) (Oral)   Resp (!) 23   Ht 5\' 1"  (1.549 m)   Wt 56.7 kg (125 lb)   SpO2 98%   BMI 23.62 kg/m   Physical Exam  Constitutional: She appears well-developed and well-nourished.  Smells of etoh  HENT:  Head: Normocephalic and atraumatic.  Right Ear: Hearing and tympanic membrane normal. No hemotympanum.  Left Ear: Hearing and tympanic membrane normal. No hemotympanum.  Nose: Nose normal.  Mouth/Throat: Abnormal dentition.    No scalp injury.   Eyes: Pupils are equal, round, and reactive to light. Conjunctivae and EOM are normal.  Neck: Normal range of motion.  Cardiovascular: Normal rate, regular rhythm, normal heart sounds and intact distal pulses.  Pulmonary/Chest: Effort normal and breath sounds normal. She has no  wheezes.  Abdominal: Soft. Bowel sounds are normal. She exhibits no distension. There is no tenderness. There is no guarding.  Musculoskeletal: Normal range of motion.       Cervical back: Normal.       Lumbar back: Normal.  Neurological: She is alert.  Skin: Skin is warm and dry. No erythema.  Psychiatric: She has a normal mood and affect.  Nursing note and vitals reviewed.    ED Treatments / Results  Labs (all labs ordered are listed, but only abnormal results are displayed) Labs Reviewed  BASIC METABOLIC PANEL - Abnormal; Notable for the following components:      Result Value   Calcium 8.4 (*)    All other components within normal limits  ETHANOL - Abnormal; Notable for the following components:   Alcohol, Ethyl (B) 161 (*)    All other components within normal limits  CBC WITH DIFFERENTIAL/PLATELET  URINALYSIS, ROUTINE W REFLEX MICROSCOPIC  CBG MONITORING, ED  I-STAT BETA HCG BLOOD, ED ([facility], [facility], [facility] ONLY)    EKG EKG Interpretation  Date/Time:  Wednesday January 23 2018 12:01:40 EDT Ventricular Rate:  72 PR Interval:    QRS Duration: 94 QT Interval:  416 QTC Calculation: 456 R Axis:   27 Text Interpretation:  Sinus arrhythmia No STEMI  Confirmed by Alona Bene [id number]) on 01/23/2018 12:12:31 PM   Radiology No results found.  Procedures Procedures (including critical care time)  Medications Ordered in ED Medications  amoxicillin (AMOXIL) capsule 500 mg (has no administration in time range)  ibuprofen (ADVIL,MOTRIN) tablet 800 mg (800 mg Oral Given 01/23/18 1258)     Initial Impression / Assessment and Plan / ED Course  I have reviewed the triage vital signs and the nursing notes.  Pertinent labs & imaging results that were available during my care of the patient were reviewed by me and considered in my medical decision making (see chart for details).     Pt with episode of syncope. etoh intoxication and dental pain.  When asked about etoh, she reports she has  had a stressful week, has drank a few beers this week.  Last night drank 1 beer and took 2 tylenol to try to control her dental pain.  She denies being intoxicated. She is here with her adult client who is driving her home.  She was ambulatory in the dept, felt safe for dc, advised to avoid driving for the remainder of today.  She was watched by RN and security - client drove away with pt in passenger seat. Possible dental abscess vs third molar eruption.  She was placed on amoxil.  Advised f/u with pcp. She was also given dental referrals and referrals for substance abuse resources.   Final Clinical Impressions(s) / ED Diagnoses   Final diagnoses:  Syncope, unspecified syncope type  Alcoholic intoxication with complication (HCC)  Pain, dental    ED Discharge Orders  Ordered    amoxicillin (AMOXIL) 500 MG capsule  3 times daily     01/23/18 1354    naproxen (NAPROSYN) 500 MG tablet  2 times daily     01/23/18 1354       Burgess Amor, PA-C 01/23/18 1600    Long, Arlyss Repress, MD 01/23/18 1627

## 2018-01-23 NOTE — ED Triage Notes (Addendum)
Pt c/o of left dental pain x 1 week.  Pt states having pain worsened today.  Pt states she passed out 30 mins ago for unknown reason. LOC and denies hitting her head

## 2018-01-24 NOTE — Congregational Nurse Program (Signed)
Congregational Nurse Program Note  Date of Encounter: 01/23/2018  Past Medical History: Past Medical History:  Diagnosis Date  . Bronchitis     Encounter Details: CNP Questionnaire - 01/23/18 0939      Questionnaire   Patient Status  Not Applicable    Race  White or Caucasian    Location Patient Served At  [facility] [place]    Insurance  Not Applicable    Uninsured  Uninsured (NEW 1x/quarter)    Food  No food insecurities    Housing/Utilities  Yes, have permanent housing    Transportation  No transportation needs    Interpersonal Safety  Yes, feel physically and emotionally safe where you currently live    Medication  No medication insecurities    Medical Provider  No    Referrals  Area Agency;Other    ED Visit Averted  Not Applicable    Life-Saving Intervention Made  Not Applicable     [facility]/o of lightheadedness, nausea/vomiting, extreme pain in tooth, stating possible abscess. Client has no dental or medical insurance. Client was alert and oriented. Assisted client  [facility] 02/14/18 1:00.  Instructed client to go the nearest ER for immediate assistance with tooth, may need an antibiotic if infection is present before seeking a dentalcare.  Discussed with client about seeking a dentist, [facility] has a dental clinic. Client giving information to call if any other needs.   Pearletha AlfredJan Rakia Frayne,RN  [phone].

## 2019-03-17 ENCOUNTER — Other Ambulatory Visit: Payer: Self-pay

## 2019-03-17 ENCOUNTER — Encounter (HOSPITAL_BASED_OUTPATIENT_CLINIC_OR_DEPARTMENT_OTHER): Payer: Self-pay | Admitting: *Deleted

## 2019-03-17 ENCOUNTER — Emergency Department (HOSPITAL_BASED_OUTPATIENT_CLINIC_OR_DEPARTMENT_OTHER)
Admission: EM | Admit: 2019-03-17 | Discharge: 2019-03-18 | Discharge status: Against Medical Advice | Payer: Self-pay | Attending: Emergency Medicine | Admitting: Emergency Medicine

## 2019-03-17 DIAGNOSIS — Z5321 Procedure and treatment not carried out due to patient leaving prior to being seen by health care provider: Secondary | ICD-10-CM | POA: Insufficient documentation

## 2019-03-17 LAB — URINALYSIS, MICROSCOPIC (REFLEX)

## 2019-03-17 LAB — URINALYSIS, ROUTINE W REFLEX MICROSCOPIC
Bilirubin Urine: NEGATIVE
Glucose, UA: NEGATIVE mg/dL
Ketones, ur: NEGATIVE mg/dL
Nitrite: POSITIVE — AB
Protein, ur: NEGATIVE mg/dL
Specific Gravity, Urine: 1.015 (ref 1.005–1.030)
pH: 6 (ref 5.0–8.0)

## 2019-03-17 LAB — PREGNANCY, URINE: Preg Test, Ur: NEGATIVE

## 2019-03-17 NOTE — ED Triage Notes (Signed)
C/o right lower back pain x 4 days ago

## 2019-03-17 NOTE — ED Notes (Signed)
Called pt to room  No response from lobby  

## 2020-11-28 ENCOUNTER — Encounter (HOSPITAL_BASED_OUTPATIENT_CLINIC_OR_DEPARTMENT_OTHER): Payer: Self-pay

## 2020-11-28 ENCOUNTER — Emergency Department (HOSPITAL_BASED_OUTPATIENT_CLINIC_OR_DEPARTMENT_OTHER)
Admission: EM | Admit: 2020-11-28 | Discharge: 2020-11-28 | Disposition: A | Discharge status: Home / Self Care | Payer: PRIVATE HEALTH INSURANCE | Attending: Emergency Medicine | Admitting: Emergency Medicine

## 2020-11-28 ENCOUNTER — Other Ambulatory Visit: Payer: Self-pay

## 2020-11-28 DIAGNOSIS — S79921A Unspecified injury of right thigh, initial encounter: Secondary | ICD-10-CM | POA: Diagnosis present

## 2020-11-28 DIAGNOSIS — F17210 Nicotine dependence, cigarettes, uncomplicated: Secondary | ICD-10-CM | POA: Diagnosis not present

## 2020-11-28 DIAGNOSIS — W57XXXA Bitten or stung by nonvenomous insect and other nonvenomous arthropods, initial encounter: Secondary | ICD-10-CM | POA: Insufficient documentation

## 2020-11-28 DIAGNOSIS — S70361A Insect bite (nonvenomous), right thigh, initial encounter: Secondary | ICD-10-CM | POA: Insufficient documentation

## 2020-11-28 DIAGNOSIS — R21 Rash and other nonspecific skin eruption: Secondary | ICD-10-CM

## 2020-11-28 MED ORDER — CETIRIZINE HCL 10 MG PO TABS: 10.0000 mg | ORAL_TABLET | Freq: Two times a day (BID) | ORAL | 0 refills | Status: AC

## 2020-11-28 MED ORDER — DOXYCYCLINE HYCLATE 100 MG PO CAPS: 100.0000 mg | ORAL_CAPSULE | Freq: Two times a day (BID) | ORAL | 0 refills | Status: AC

## 2020-11-28 MED ORDER — PREDNISONE 50 MG PO TABS: 50.0000 mg | ORAL_TABLET | Freq: Every day | ORAL | 0 refills | Status: AC

## 2020-11-28 MED ORDER — PREDNISONE 50 MG PO TABS
60.0000 mg | ORAL_TABLET | Freq: Once | ORAL | Status: AC
  Administered 2020-11-28: 60 mg via ORAL
  Filled 2020-11-28: qty 1

## 2020-11-28 NOTE — Discharge Instructions (Signed)
Your rash is likely an irritant reaction from your tick bite.  While I have lower suspicion for transmission of the [facility] spotted fever bacteria, would like to cover you with doxycycline.  Please take as directed.  Also like for you to take the cetirizine and prednisone burst, as prescribed.  Do not combine cetirizine with the Benadryl.  Please follow-up with your primary care provider for ongoing evaluation and management.  Return to the ED or seek immediate medical attention should you experience any new or worsening symptoms.

## 2020-11-28 NOTE — ED Provider Notes (Signed)
[facility] EMERGENCY DEPARTMENT Provider Note   CSN: [id number] Arrival date & time: 11/28/20  1102     History Chief Complaint  Patient presents with  . Rash    Whitney [name] is a 35 y.o. female with no relevant past medical history who presents the ED with complaints of generalized rash with itching after being bitten by a tick last week.  On my examination, patient states that 5 days ago she felt a American dog tick on her right thigh.  She states that it was very small tick, was not engorged.  She states that it was only on for likely 2 hours, certainly less than 12 hours.  Over the course of the past couple of days, she has developed a itchy, burning rash, most notably over her back.  She states that it started on her stomach and has since gone to her back.  She also has mild rash over the back of her knees bilaterally.  She denies any origination near her wrists or ankles.  She also denies any target sign rash.  She denies any fevers or chills, but does endorse headache and muscle soreness/joint pains.  She denies any joint swelling.  She also denies any chest pain, shortness of breath, new medications, new detergents or other possible precipitating factors, or any obvious sick contacts.  She has 2 children that have been asymptomatic entirely.  She suspects that her tick came to her by means of her dog.  She has tried taking Benadryl, with some relief.  She also tried an Epsom salt bath which seemed to make it worse.  HPI     Past Medical History:  Diagnosis Date  . Bronchitis     There are no problems to display for this patient.   Past Surgical History:  Procedure Laterality Date  . ESSURE TUBAL LIGATION    . TUBAL LIGATION       OB History   No obstetric history on file.     History reviewed. No pertinent family history.  Social History   Tobacco Use  . Smoking status: Current Every Day Smoker    Packs/day: 0.50    Types: Cigarettes  .  Smokeless tobacco: Never Used  Substance Use Topics  . Alcohol use: Yes    Comment: occassional  . Drug use: Yes    Types: Marijuana    Comment: ocassional    Home Medications Prior to Admission medications   Medication Sig Start Date End Date Taking? Authorizing Provider  cetirizine (ZYRTEC) 10 MG tablet Take 1 tablet (10 mg total) by mouth 2 (two) times daily for 10 days. 11/28/20 12/08/20 Yes Lorelee New, PA-C  doxycycline (VIBRAMYCIN) 100 MG capsule Take 1 capsule (100 mg total) by mouth 2 (two) times daily for 7 days. 11/28/20 12/05/20 Yes Lorelee New, PA-C  naproxen (NAPROSYN) 500 MG tablet Take 1 tablet (500 mg total) by mouth 2 (two) times daily. 01/23/18   Burgess Amor, PA-C    Allergies    Percocet [oxycodone-acetaminophen]  Review of Systems   Review of Systems  All other systems reviewed and are negative.   Physical Exam Updated Vital Signs BP (!) 153/101 (BP Location: Right Arm)   Pulse 86   Temp 98.6 F (37 C) (Oral)   Resp 18   Ht 5' 1.5" (1.562 m)   Wt 68 kg   LMP 10/29/2020   SpO2 100%   BMI 27.88 kg/m   Physical Exam Vitals and nursing  note reviewed. Exam conducted with a chaperone present.  Constitutional:      Appearance: Normal appearance.  HENT:     Head: Normocephalic and atraumatic.  Eyes:     General: No scleral icterus.    Conjunctiva/sclera: Conjunctivae normal.  Cardiovascular:     Rate and Rhythm: Normal rate.     Pulses: Normal pulses.  Pulmonary:     Effort: Pulmonary effort is normal. No respiratory distress.  Musculoskeletal:        General: No swelling or deformity. Normal range of motion.     Comments: No areas of joint swelling.  ROM fully intact.  No significant midline tenderness.  Skin:    General: Skin is dry.     Findings: Rash present.     Comments: I examined the area of the tick bite removal.  No cellulitis or induration.  No erythema.  No purulent drainage.  No swelling.  No surrounding target sign. She does  have a papular pinpoint morbilliform rash predominantly over her back.  Her wrists and ankles are spared.  No intraoral lesions.  Negative Nikolsky sign.  Neurological:     Mental Status: She is alert.     GCS: GCS eye subscore is 4. GCS verbal subscore is 5. GCS motor subscore is 6.  Psychiatric:        Mood and Affect: Mood normal.        Behavior: Behavior normal.        Thought Content: Thought content normal.     ED Results / Procedures / Treatments   Labs (all labs ordered are listed, but only abnormal results are displayed) Labs Reviewed - No data to display  EKG None  Radiology No results found.  Procedures Procedures   Medications Ordered in ED Medications  predniSONE (DELTASONE) tablet 60 mg (has no administration in time range)    ED Course  I have reviewed the triage vital signs and the nursing notes.  Pertinent labs & imaging results that were available during my care of the patient were reviewed by me and considered in my medical decision making (see chart for details).    MDM Rules/Calculators/A&P                          Whitney [name] was evaluated in Emergency Department on 11/28/2020 for the symptoms described in the history of present illness. She was evaluated in the context of the global COVID-19 pandemic, which necessitated consideration that the patient might be at risk for infection with the SARS-CoV-2 virus that causes COVID-19. Institutional protocols and algorithms that pertain to the evaluation of patients at risk for COVID-19 are in a state of rapid change based on information released by regulatory bodies including the CDC and federal and state organizations. These policies and algorithms were followed during the patient's care in the ED.  I personally reviewed patient's medical chart and all notes from triage and staff during today's encounter. I have also ordered and reviewed all labs and imaging that I felt to be medically necessary in the  evaluation of this patient's complaints and with consideration of their physical exam. If needed, translation services were available and utilized.   Patient with itchy, burning rash over her back which she attributes to a tick bite last week.  She identified it as an Programme researcher, broadcasting/film/video tick.  It was not engorged and only on for a short duration of time, I have lower suspicion for  acute transmission.  However, given her joint pains, body aches, and rash, will cover with doxycycline x7 days.  We will also treat with prednisone here in the ED as she describes it as itching and "burning".  Negative Nikolsky sign.  No new medications.  She has been afebrile.  She will follow-up with her primary care provider for ongoing evaluation and management.  We will discharge her home with continued prednisone burst and cetirizine 10 mg twice daily x10 days to take instead of Benadryl.  ED return precautions discussed.  Patient voices understanding and is agreeable to the plan.  Final Clinical Impression(s) / ED Diagnoses Final diagnoses:  Rash  Tick bite of right thigh, initial encounter    Rx / DC Orders ED Discharge Orders         Ordered    doxycycline (VIBRAMYCIN) 100 MG capsule  2 times daily        11/28/20 1338    cetirizine (ZYRTEC) 10 MG tablet  2 times daily        11/28/20 1338           Elvera Maria 11/28/20 1339    Rolan Bucco, MD 11/28/20 1434

## 2020-11-28 NOTE — ED Triage Notes (Signed)
Pt was bit by a tick on Tuesday. C/o generalized rash with itching since Wednesday. Also c/o muscle soreness and headache.

## 2021-10-17 ENCOUNTER — Other Ambulatory Visit: Payer: Self-pay

## 2021-10-17 ENCOUNTER — Emergency Department (HOSPITAL_BASED_OUTPATIENT_CLINIC_OR_DEPARTMENT_OTHER)
Admission: EM | Admit: 2021-10-17 | Discharge: 2021-10-18 | Disposition: A | Discharge status: Home / Self Care | Payer: PRIVATE HEALTH INSURANCE | Attending: Emergency Medicine | Admitting: Emergency Medicine

## 2021-10-17 ENCOUNTER — Encounter (HOSPITAL_BASED_OUTPATIENT_CLINIC_OR_DEPARTMENT_OTHER): Payer: Self-pay | Admitting: *Deleted

## 2021-10-17 DIAGNOSIS — F17210 Nicotine dependence, cigarettes, uncomplicated: Secondary | ICD-10-CM | POA: Insufficient documentation

## 2021-10-17 DIAGNOSIS — R0602 Shortness of breath: Secondary | ICD-10-CM | POA: Diagnosis present

## 2021-10-17 DIAGNOSIS — J209 Acute bronchitis, unspecified: Secondary | ICD-10-CM | POA: Insufficient documentation

## 2021-10-17 MED ORDER — IPRATROPIUM-ALBUTEROL 0.5-2.5 (3) MG/3ML IN SOLN
3.0000 mL | RESPIRATORY_TRACT | Status: DC
  Administered 2021-10-18: 3 mL via RESPIRATORY_TRACT
  Filled 2021-10-17: qty 3

## 2021-10-17 NOTE — ED Provider Notes (Signed)
? ?[facility] ?Provider Note: Georgena Spurling, MD, [place] ? ?CSN: [id number] ?MRN: [id number] ?ARRIVAL: 10/17/21 at 2300 ?ROOM: MH11/MH11 ? ? ?CHIEF COMPLAINT  ?Cough and Shortness of Breath ? ? ?HISTORY OF PRESENT ILLNESS  ?10/17/21 11:50 PM ?Whitney [name] is a 36 y.o. female 6 days of cough and shortness of breath.  The cough is severe and persistent.  She has had 1 episode of posttussive emesis.  She had a fever at the beginning of her illness but none recently.  She has had nasal congestion and rhinorrhea with this.  She denies any GI symptoms.  She denies sore throat.  She has not taken anything for this.  Her chest is sore when she coughs and she rates this as a 7 out of 10. ? ? ?Past Medical History:  ?Diagnosis Date  ? Bronchitis   ? ? ?Past Surgical History:  ?Procedure Laterality Date  ? ESSURE TUBAL LIGATION    ? TUBAL LIGATION    ? ? ?No family history on file. ? ?Social History  ? ?Tobacco Use  ? Smoking status: Every Day  ?  Packs/day: 0.50  ?  Types: Cigarettes  ? Smokeless tobacco: Never  ?Vaping Use  ? Vaping Use: Never used  ?Substance Use Topics  ? Alcohol use: Yes  ?  Comment: occassional  ? Drug use: Not Currently  ?  Types: Marijuana  ?  Comment: ocassional  ? ? ?Prior to Admission medications   ?Medication Sig Start Date End Date Taking? Authorizing Provider  ?chlorpheniramine-HYDROcodone (TUSSIONEX PENNKINETIC ER) 10-8 MG/5ML Take 5 mLs by mouth every 12 (twelve) hours as needed for cough. 10/18/21  Yes Pape Parson, MD  ?cetirizine (ZYRTEC) 10 MG tablet Take 1 tablet (10 mg total) by mouth 2 (two) times daily for 10 days. 11/28/20 12/08/20  Corena Herter, PA-C  ? ? ?Allergies ?Percocet [oxycodone-acetaminophen] ? ? ?REVIEW OF SYSTEMS  ?Negative except as noted here or in the History of Present Illness. ? ? ?PHYSICAL EXAMINATION  ?Initial Vital Signs ?Blood pressure 118/75, pulse 92, temperature 98.3 ?F (36.8 ?C), temperature source Oral, resp. rate 20, height 5' 5.5" (1.664 m),  weight 68 kg, last menstrual period 09/20/2021, SpO2 93 %. ? ?Examination ?General: Well-developed, well-nourished female in no acute distress; appearance consistent with age of record ?HENT: normocephalic; atraumatic; no pharyngeal erythema or exudate ?Eyes: Normal appearance ?Neck: supple ?Heart: regular rate and rhythm ?Lungs: clear to auscultation bilaterally; persistent cough ?Abdomen: soft; nondistended; nontender; bowel sounds present ?Extremities: No deformity; full range of motion ?Neurologic: Awake, alert and oriented; motor function intact in all extremities and symmetric; no facial droop ?Skin: Warm and dry ?Psychiatric: Normal mood and affect ? ? ?RESULTS  ?Summary of this visit's results, reviewed and interpreted by myself: ? ? EKG Interpretation ? ?Date/Time:    ?Ventricular Rate:    ?PR Interval:    ?QRS Duration:   ?QT Interval:    ?QTC Calculation:   ?R Axis:     ?Text Interpretation:   ?  ? ?  ? ?Laboratory Studies: ?No results found for this or any previous visit (from the past 24 hour(s)). ?Imaging Studies: ?DG Chest 2 View ? ?Result Date: 10/18/2021 ?CLINICAL DATA:  Coughing and shortness of breath. EXAM: CHEST - 2 VIEW COMPARISON:  PA Lat 07/10/2014. FINDINGS: The heart size and mediastinal contours are within normal limits. Both lungs are clear. There is interval development skeletal osteopenia and new demonstration of a mild-to-moderate upper plate anterior wedge compression fracture  deformity of the T12 vertebral body. Anterior vertebral height loss is a proximally 30%, posterior height loss no more than 10% with trace retropulsion. Age is indeterminate. IMPRESSION: 1. No evidence of acute chest disease. 2. Interval new finding of osteopenia and age-indeterminate mild-to-moderate upper plate anterior wedge compression fracture of the T12 vertebral body. Electronically Signed   By: Telford Nab M.D.   On: 10/18/2021 00:23   ? ?ED COURSE and MDM  ?Nursing notes, initial and subsequent vitals  signs, including pulse oximetry, reviewed and interpreted by myself. ? ?Vitals:  ? 10/17/21 2312 10/17/21 2313  ?BP: 118/75   ?Pulse: 92   ?Resp: 20   ?Temp: 98.3 ?F (36.8 ?C)   ?TempSrc: Oral   ?SpO2: 93%   ?Weight:  68 kg  ?Height:  5' 5.5" (1.664 m)  ? ?Medications  ?ipratropium-albuterol (DUONEB) 0.5-2.5 (3) MG/3ML nebulizer solution 3 mL (3 mLs Nebulization Given 10/18/21 0030)  ?albuterol (VENTOLIN HFA) 108 (90 Base) MCG/ACT inhaler 2 puff (has no administration in time range)  ?dexamethasone (DECADRON) injection 10 mg (has no administration in time range)  ?chlorpheniramine-HYDROcodone 10-8 MG/5ML suspension 5 mL (has no administration in time range)  ? ?12:52 AM ?Cough and breathing improved after DuoNeb treatment.  Will provide patient with albuterol inhaler and AeroChamber and instructed in their use.  We will also give her a dose of dexamethasone. ? ? ?PROCEDURES  ?Procedures ? ? ?ED DIAGNOSES  ? ?  ICD-10-CM   ?1. Acute bronchitis with bronchospasm  J20.9   ?  ? ? ? ?  ?Shanon Rosser, MD ?10/18/21 [id number] ? ?

## 2021-10-17 NOTE — ED Triage Notes (Addendum)
Cough and sob for a week. She has had 3 negative covid test. ?

## 2021-10-18 ENCOUNTER — Emergency Department (HOSPITAL_BASED_OUTPATIENT_CLINIC_OR_DEPARTMENT_OTHER): Payer: PRIVATE HEALTH INSURANCE

## 2021-10-18 MED ORDER — ALBUTEROL SULFATE HFA 108 (90 BASE) MCG/ACT IN AERS
2.0000 | INHALATION_SPRAY | RESPIRATORY_TRACT | Status: DC | PRN
  Filled 2021-10-18: qty 6.7

## 2021-10-18 MED ORDER — HYDROCOD POLI-CHLORPHE POLI ER 10-8 MG/5ML PO SUER: 5.0000 mL | Freq: Two times a day (BID) | ORAL | 0 refills | Status: AC | PRN

## 2021-10-18 MED ORDER — DEXAMETHASONE SODIUM PHOSPHATE 10 MG/ML IJ SOLN
10.0000 mg | Freq: Once | INTRAMUSCULAR | Status: AC
  Administered 2021-10-18: 10 mg via INTRAMUSCULAR
  Filled 2021-10-18: qty 1

## 2021-10-18 MED ORDER — HYDROCOD POLI-CHLORPHE POLI ER 10-8 MG/5ML PO SUER
5.0000 mL | Freq: Once | ORAL | Status: AC
  Administered 2021-10-18: 5 mL via ORAL
  Filled 2021-10-18: qty 5

## 2021-10-18 NOTE — ED Notes (Signed)
Pt has returned from xray

## 2021-10-18 NOTE — ED Notes (Signed)
Patient transported to X-ray 

## 2022-12-04 ENCOUNTER — Emergency Department (HOSPITAL_BASED_OUTPATIENT_CLINIC_OR_DEPARTMENT_OTHER)
Admission: EM | Admit: 2022-12-04 | Discharge: 2022-12-05 | Disposition: A | Discharge status: Home / Self Care | Payer: PRIVATE HEALTH INSURANCE | Attending: Emergency Medicine | Admitting: Emergency Medicine

## 2022-12-04 ENCOUNTER — Emergency Department (HOSPITAL_BASED_OUTPATIENT_CLINIC_OR_DEPARTMENT_OTHER): Payer: PRIVATE HEALTH INSURANCE

## 2022-12-04 ENCOUNTER — Encounter (HOSPITAL_BASED_OUTPATIENT_CLINIC_OR_DEPARTMENT_OTHER): Payer: Self-pay

## 2022-12-04 ENCOUNTER — Other Ambulatory Visit: Payer: Self-pay

## 2022-12-04 DIAGNOSIS — I10 Essential (primary) hypertension: Secondary | ICD-10-CM | POA: Insufficient documentation

## 2022-12-04 DIAGNOSIS — R7309 Other abnormal glucose: Secondary | ICD-10-CM | POA: Insufficient documentation

## 2022-12-04 DIAGNOSIS — N3000 Acute cystitis without hematuria: Secondary | ICD-10-CM | POA: Insufficient documentation

## 2022-12-04 LAB — BASIC METABOLIC PANEL
Anion gap: 11 (ref 5–15)
BUN: 5 mg/dL — ABNORMAL LOW (ref 6–20)
CO2: 24 mmol/L (ref 22–32)
Calcium: 8.7 mg/dL — ABNORMAL LOW (ref 8.9–10.3)
Chloride: 102 mmol/L (ref 98–111)
Creatinine, Ser: 0.68 mg/dL (ref 0.44–1.00)
GFR, Estimated: >60 mL/min (ref >60)
Glucose, Bld: 97 mg/dL (ref 70–99)
Potassium: 3.2 mmol/L — ABNORMAL LOW (ref 3.5–5.1)
Sodium: 137 mmol/L (ref 135–145)

## 2022-12-04 LAB — CBC
HCT: 38.9 % (ref 36.0–46.0)
Hemoglobin: 13.1 g/dL (ref 12.0–15.0)
MCH: 35.2 pg — ABNORMAL HIGH (ref 26.0–34.0)
MCHC: 33.7 g/dL (ref 30.0–36.0)
MCV: 104.6 fL — ABNORMAL HIGH (ref 80.0–100.0)
Platelets: 316 10*3/uL (ref 150–400)
RBC: 3.72 MIL/uL — ABNORMAL LOW (ref 3.87–5.11)
RDW: 14.3 % (ref 11.5–15.5)
WBC: 12.2 10*3/uL — ABNORMAL HIGH (ref 4.0–10.5)
nRBC: 0 % (ref 0.0–0.2)

## 2022-12-04 NOTE — ED Triage Notes (Signed)
Pt reports high blood pressure, went to work, felt "off" so she took her BP at work today and it was 158/99 and again tonight and it was 160/120. No hx of HTN, does not have HTN meds. She reports headache, dizziness with standing, blurry vision onset today at 1pm. BP-173/106

## 2022-12-05 ENCOUNTER — Emergency Department (HOSPITAL_BASED_OUTPATIENT_CLINIC_OR_DEPARTMENT_OTHER): Payer: PRIVATE HEALTH INSURANCE

## 2022-12-05 LAB — URINALYSIS, MICROSCOPIC (REFLEX)

## 2022-12-05 LAB — PREGNANCY, URINE: Preg Test, Ur: NEGATIVE

## 2022-12-05 LAB — URINALYSIS, ROUTINE W REFLEX MICROSCOPIC
Bilirubin Urine: NEGATIVE
Glucose, UA: NEGATIVE mg/dL
Ketones, ur: NEGATIVE mg/dL
Leukocytes,Ua: NEGATIVE
Nitrite: POSITIVE — AB
Protein, ur: NEGATIVE mg/dL
Specific Gravity, Urine: >=1.03 (ref 1.005–1.030)
pH: 5.5 (ref 5.0–8.0)

## 2022-12-05 LAB — TROPONIN I (HIGH SENSITIVITY): Troponin I (High Sensitivity): 2 ng/L (ref <18)

## 2022-12-05 MED ORDER — HYDROCHLOROTHIAZIDE 25 MG PO TABS: 25.0000 mg | ORAL_TABLET | Freq: Every day | ORAL | 0 refills | Status: AC

## 2022-12-05 MED ORDER — HYDROCHLOROTHIAZIDE 25 MG PO TABS
25.0000 mg | ORAL_TABLET | Freq: Once | ORAL | Status: AC
  Administered 2022-12-05: 25 mg via ORAL
  Filled 2022-12-05: qty 1

## 2022-12-05 MED ORDER — CEPHALEXIN 500 MG PO CAPS: 500.0000 mg | ORAL_CAPSULE | Freq: Four times a day (QID) | ORAL | 0 refills | Status: AC

## 2022-12-05 MED ORDER — ACETAMINOPHEN 500 MG PO TABS
1000.0000 mg | ORAL_TABLET | Freq: Once | ORAL | Status: AC
  Administered 2022-12-05: 1000 mg via ORAL
  Filled 2022-12-05: qty 2

## 2022-12-05 MED ORDER — CEPHALEXIN 250 MG PO CAPS
500.0000 mg | ORAL_CAPSULE | Freq: Once | ORAL | Status: AC
  Administered 2022-12-05: 500 mg via ORAL
  Filled 2022-12-05: qty 2

## 2022-12-05 NOTE — ED Provider Notes (Signed)
[place] [facility] Provider Note   CSN: [id number] Arrival date & time: 12/04/22  2234     History  Chief Complaint  Patient presents with   Hypertension    Whitney [name] is a 37 y.o. female.  The history is provided by the patient.  Hypertension Chronicity: seen at previous visits but unknown to the patient.  Patient does not have a PMD. The problem occurs constantly. The problem has not changed since onset.Associated symptoms include headaches. Pertinent negatives include no chest pain, no abdominal pain and no shortness of breath. Nothing aggravates the symptoms. Nothing relieves the symptoms. She has tried nothing for the symptoms. The treatment provided no relief.  Patient with no PMH No focal neuro deficits has had a headache.       Home Medications Prior to Admission medications   Medication Sig Start Date End Date Taking? Authorizing Provider  cetirizine (ZYRTEC) 10 MG tablet Take 1 tablet (10 mg total) by mouth 2 (two) times daily for 10 days. 11/28/20 12/08/20  Lorelee New, PA-C  chlorpheniramine-HYDROcodone (TUSSIONEX PENNKINETIC ER) 10-8 MG/5ML Take 5 mLs by mouth every 12 (twelve) hours as needed for cough. 10/18/21   Molpus, Jonny Ruiz, MD      Allergies    Percocet [oxycodone-acetaminophen]    Review of Systems   Review of Systems  Constitutional:  Negative for fever.  Respiratory:  Negative for shortness of breath.   Cardiovascular:  Negative for chest pain.  Gastrointestinal:  Negative for abdominal pain.  Genitourinary:  Negative for dysuria.  Neurological:  Positive for headaches. Negative for seizures, facial asymmetry, speech difficulty, weakness and light-headedness.  All other systems reviewed and are negative.   Physical Exam Updated Vital Signs BP (!) 143/97   Pulse 72   Temp 97.8 F (36.6 C)   Resp 16   Ht 5' 1.5" (1.562 m)   Wt 68 kg   LMP 11/14/2022 (Approximate)   SpO2 100%   BMI 27.88 kg/m   Physical Exam Vitals and nursing note reviewed.  Constitutional:      General: She is not in acute distress.    Appearance: Normal appearance. She is well-developed.  HENT:     Head: Normocephalic and atraumatic.     Nose: Nose normal.     Mouth/Throat:     Mouth: Mucous membranes are moist.     Pharynx: Oropharynx is clear.  Eyes:     Extraocular Movements: Extraocular movements intact.     Pupils: Pupils are equal, round, and reactive to light.  Cardiovascular:     Rate and Rhythm: Normal rate and regular rhythm.     Pulses: Normal pulses.     Heart sounds: Normal heart sounds.  Pulmonary:     Effort: Pulmonary effort is normal. No respiratory distress.     Breath sounds: Normal breath sounds.  Abdominal:     General: Bowel sounds are normal. There is no distension.     Palpations: Abdomen is soft.     Tenderness: There is no abdominal tenderness. There is no guarding or rebound.  Genitourinary:    Vagina: No vaginal discharge.  Musculoskeletal:        General: Normal range of motion.     Cervical back: Normal range of motion and neck supple.  Skin:    General: Skin is dry.     Capillary Refill: Capillary refill takes less than 2 seconds.     Findings: No erythema or rash.  Neurological:  General: No focal deficit present.     Mental Status: She is alert and oriented to person, place, and time.     Deep Tendon Reflexes: Reflexes normal.  Psychiatric:        Mood and Affect: Mood normal.     ED Results / Procedures / Treatments   Labs (all labs ordered are listed, but only abnormal results are displayed) Results for orders placed or performed during the hospital encounter of 12/04/22  Basic metabolic panel  Result Value Ref Range   Sodium 137 135 - 145 mmol/L   Potassium 3.2 (L) 3.5 - 5.1 mmol/L   Chloride 102 98 - 111 mmol/L   CO2 24 22 - 32 mmol/L   Glucose, Bld 97 70 - 99 mg/dL   BUN 5 (L) 6 - 20 mg/dL   Creatinine, Ser 7.42 0.44 - 1.00 mg/dL   Calcium  8.7 (L) 8.9 - 10.3 mg/dL   GFR, Estimated >59 >56 mL/min   Anion gap 11 5 - 15  CBC  Result Value Ref Range   WBC 12.2 (H) 4.0 - 10.5 K/uL   RBC 3.72 (L) 3.87 - 5.11 MIL/uL   Hemoglobin 13.1 12.0 - 15.0 g/dL   HCT 38.7 56.4 - 33.2 %   MCV 104.6 (H) 80.0 - 100.0 fL   MCH 35.2 (H) 26.0 - 34.0 pg   MCHC 33.7 30.0 - 36.0 g/dL   RDW 95.1 88.4 - 16.6 %   Platelets 316 150 - 400 K/uL   nRBC 0.0 0.0 - 0.2 %  Urinalysis, Routine w reflex microscopic -Urine, Clean Catch  Result Value Ref Range   Color, Urine YELLOW YELLOW   APPearance TURBID (A) CLEAR   Specific Gravity, Urine >=1.030 1.005 - 1.030   pH 5.5 5.0 - 8.0   Glucose, UA NEGATIVE NEGATIVE mg/dL   Hgb urine dipstick SMALL (A) NEGATIVE   Bilirubin Urine NEGATIVE NEGATIVE   Ketones, ur NEGATIVE NEGATIVE mg/dL   Protein, ur NEGATIVE NEGATIVE mg/dL   Nitrite POSITIVE (A) NEGATIVE   Leukocytes,Ua NEGATIVE NEGATIVE  Pregnancy, urine  Result Value Ref Range   Preg Test, Ur NEGATIVE NEGATIVE  Urinalysis, Microscopic (reflex)  Result Value Ref Range   RBC / HPF 0-5 0 - 5 RBC/hpf   WBC, UA 0-5 0 - 5 WBC/hpf   Bacteria, UA MANY (A) NONE SEEN   Squamous Epithelial / HPF 0-5 0 - 5 /HPF   Mucus PRESENT   Troponin I (High Sensitivity)  Result Value Ref Range   Troponin I (High Sensitivity) 2 <18 ng/L   No results found.  EKG  EKG Interpretation  Date/Time:  Monday Dec 04 2022 22:43:37 EDT Ventricular Rate:  97 PR Interval:  160 QRS Duration: 90 QT Interval:  384 QTC Calculation: 488 R Axis:   34 Text Interpretation: Sinus rhythm Confirmed by Nicanor Alcon, Kraven Calk ([id number]) on 12/05/2022 2:33:20 AM        Radiology No results found.  Procedures Procedures    Medications Ordered in ED Medications  acetaminophen (TYLENOL) tablet 1,000 mg (1,000 mg Oral Given 12/05/22 0015)  hydrochlorothiazide (HYDRODIURIL) tablet 25 mg (25 mg Oral Given 12/05/22 0015)  cephALEXin (KEFLEX) capsule 500 mg (500 mg Oral Given 12/05/22 0214)    ED  Course/ Medical Decision Making/ A&P                             Medical Decision Making Patient with elevated BP and headache  Amount and/or Complexity of Data Reviewed External Data Reviewed: notes.    Details: Previous notes reviewed  Labs: ordered.    Details: Normal 137, potassium low 3.2, normal creatinine .68.  White count slight elevation 12.2, normal hemoglobin 13.1,  normal platelets.  Urine consistent with UTI.  Troponin 2  Radiology: ordered and independent interpretation performed.    Details: Negative head CT  Risk OTC drugs. Prescription drug management. Risk Details: Pressure elevated but has been in the past. I will start medication and refer to PMD for ongoing care. Will start on Keflex for UTI.  Stable for discharge.  Strict return     Final Clinical Impression(s) / ED Diagnoses Final diagnoses:  Primary hypertension  Acute cystitis without hematuria   Return for intractable cough, coughing up blood, fevers > 100.4 unrelieved by medication, shortness of breath, intractable vomiting, chest pain, shortness of breath, weakness, numbness, changes in speech, facial asymmetry, abdominal pain, passing out, Inability to tolerate liquids or food, cough, altered mental status or any concerns. No signs of systemic illness or infection. The patient is nontoxic-appearing on exam and vital signs are within normal limits.  I have reviewed the triage vital signs and the nursing notes. Pertinent labs & imaging results that were available during my care of the patient were reviewed by me and considered in my medical decision making (see chart for details). After history, exam, and medical workup I feel the patient has been  Insert SmartText  Patient appropriately medically screened and is safe for discharge home. Pertinent diagnoses were discussed with the patient. Patient was given return precautions Rx / DC Orders ED Discharge Orders     None         Erlene Devita,  MD 12/05/22 [phone]

## 2022-12-05 NOTE — ED Notes (Signed)
Pt states she has no hx of HTN, took her bp at work and again [employer] and it was elevated.   Pt endorses dizziness and HA as well.

## 2024-08-16 ENCOUNTER — Other Ambulatory Visit: Payer: Self-pay

## 2024-08-16 ENCOUNTER — Emergency Department (HOSPITAL_BASED_OUTPATIENT_CLINIC_OR_DEPARTMENT_OTHER): Payer: PRIVATE HEALTH INSURANCE

## 2024-08-16 ENCOUNTER — Emergency Department (HOSPITAL_BASED_OUTPATIENT_CLINIC_OR_DEPARTMENT_OTHER)
Admission: EM | Admit: 2024-08-16 | Discharge: 2024-08-16 | Disposition: A | Discharge status: Home / Self Care | Payer: PRIVATE HEALTH INSURANCE | Attending: Emergency Medicine | Admitting: Emergency Medicine

## 2024-08-16 ENCOUNTER — Encounter (HOSPITAL_BASED_OUTPATIENT_CLINIC_OR_DEPARTMENT_OTHER): Payer: Self-pay

## 2024-08-16 DIAGNOSIS — Z79899 Other long term (current) drug therapy: Secondary | ICD-10-CM | POA: Diagnosis not present

## 2024-08-16 DIAGNOSIS — X501XXA Overexertion from prolonged static or awkward postures, initial encounter: Secondary | ICD-10-CM | POA: Insufficient documentation

## 2024-08-16 DIAGNOSIS — I10 Essential (primary) hypertension: Secondary | ICD-10-CM | POA: Insufficient documentation

## 2024-08-16 DIAGNOSIS — R0789 Other chest pain: Secondary | ICD-10-CM | POA: Insufficient documentation

## 2024-08-16 DIAGNOSIS — M25511 Pain in right shoulder: Secondary | ICD-10-CM | POA: Insufficient documentation

## 2024-08-16 DIAGNOSIS — W19XXXA Unspecified fall, initial encounter: Secondary | ICD-10-CM

## 2024-08-16 DIAGNOSIS — R0781 Pleurodynia: Secondary | ICD-10-CM | POA: Diagnosis present

## 2024-08-16 HISTORY — DX: Essential (primary) hypertension: I10

## 2024-08-16 MED ORDER — LIDOCAINE 5 % EX PTCH
1.0000 | MEDICATED_PATCH | CUTANEOUS | Status: DC
  Administered 2024-08-16: 1 via TRANSDERMAL
  Filled 2024-08-16: qty 1

## 2024-08-16 MED ORDER — KETOROLAC TROMETHAMINE 15 MG/ML IJ SOLN
15.0000 mg | Freq: Once | INTRAMUSCULAR | Status: AC
  Administered 2024-08-16: 15 mg via INTRAMUSCULAR
  Filled 2024-08-16: qty 1

## 2024-08-16 MED ORDER — NAPROXEN 500 MG PO TABS: 500.0000 mg | ORAL_TABLET | Freq: Two times a day (BID) | ORAL | 0 refills | Status: AC

## 2024-08-16 MED ORDER — METHOCARBAMOL 500 MG PO TABS: 500.0000 mg | ORAL_TABLET | Freq: Two times a day (BID) | ORAL | 0 refills | Status: AC

## 2024-08-16 NOTE — ED Triage Notes (Signed)
 Pt states that she fell x 1 week ago. Pt states that she feels a popping in her chest ever since. States that popping sensation in her right upper right side and shoulder.

## 2024-08-16 NOTE — Discharge Instructions (Addendum)
 It was a pleasure taking care of you today.  As discussed, your x-rays do not show any broken bones.  I am sending you home with pain medication and a muscle relaxer.  Muscle relaxer can cause drowsiness so do not drive or operate machinery while on the medication.  Please follow-up with PCP if symptoms do not improve over the next week.  Return to the ER for any worsening symptoms.

## 2024-08-16 NOTE — ED Notes (Signed)
 Patient transported to X-ray

## 2024-08-16 NOTE — ED Provider Notes (Signed)
 " [place] [facility] Provider Note   CSN: [id number] Arrival date & time: 08/16/24  1018     Patient presents with: Whitney [name] LABOR [place] is a 39 y.o. female with a past medical history significant for hypertension who presents to the ED due to right sided rib and shoulder pain x 1 week.  Patient notes she fell in the shower 1 week ago landing directly on her right side (shoulder, ribs, hip) and left knee.  No head injury or LOC. Not on any blood thinners. Notes her pain resolved a few days ago however, yesterday she was walking her dog who pulled her causing her pain in her right side of ribs to return. Admits to pain with palpation and deep inspiration. Admits to a popping sensation in right side anterior chest wall. Denies SOB. No central CP. Has been taking OTC ibuprofen  and tylenol  with some relief.   History obtained from patient and past medical records. No interpreter used during encounter.       Prior to Admission medications  Medication Sig Start Date End Date Taking? Authorizing Provider  methocarbamol  (ROBAXIN ) 500 MG tablet Take 1 tablet (500 mg total) by mouth 2 (two) times daily. 08/16/24  Yes Eliya Geiman C, PA-C  naproxen  (NAPROSYN ) 500 MG tablet Take 1 tablet (500 mg total) by mouth 2 (two) times daily. 08/16/24  Yes Aurel Nguyen, Aleck BROCKS, PA-C  cephALEXin  (KEFLEX ) 500 MG capsule Take 1 capsule (500 mg total) by mouth 4 (four) times daily. 12/05/22   Palumbo, April, MD  cetirizine  (ZYRTEC ) 10 MG tablet Take 1 tablet (10 mg total) by mouth 2 (two) times daily for 10 days. 11/28/20 12/08/20  Landy Honora CROME, PA-C  chlorpheniramine-HYDROcodone (TUSSIONEX PENNKINETIC ER) 10-8 MG/5ML Take 5 mLs by mouth every 12 (twelve) hours as needed for cough. 10/18/21   Molpus, John, MD  hydrochlorothiazide  (HYDRODIURIL ) 25 MG tablet Take 1 tablet (25 mg total) by mouth daily. 12/05/22   Palumbo, April, MD    Allergies: Percocet [oxycodone-acetaminophen ]     Review of Systems  Constitutional:  Negative for fever.  Respiratory:  Negative for shortness of breath.   Cardiovascular:  Positive for chest pain (right side rib pain).  Musculoskeletal:  Positive for arthralgias.    Updated Vital Signs BP (!) 155/107 (BP Location: Right Arm)   Pulse (!) 103   Temp 98.3 F (36.8 C) (Oral)   Resp 16   Ht 5' 1 (1.549 m)   Wt 68 kg   SpO2 98%   BMI 28.33 kg/m   Physical Exam Vitals and nursing note reviewed.  Constitutional:      General: She is not in acute distress.    Appearance: She is not ill-appearing.  HENT:     Head: Normocephalic.  Eyes:     Pupils: Pupils are equal, round, and reactive to light.  Cardiovascular:     Rate and Rhythm: Normal rate and regular rhythm.     Pulses: Normal pulses.     Heart sounds: Normal heart sounds. No murmur heard.    No friction rub. No gallop.  Pulmonary:     Effort: Pulmonary effort is normal.     Breath sounds: Normal breath sounds.     Comments: Respirations equal and unlabored, patient able to speak in full sentences, lungs clear to auscultation bilaterally Chest:       Comments: Reproducible tenderness to right anterior chest wall.  No crepitus or deformity. Abdominal:  General: Abdomen is flat. There is no distension.     Palpations: Abdomen is soft.     Tenderness: There is no abdominal tenderness. There is no guarding or rebound.  Musculoskeletal:        General: Normal range of motion.     Cervical back: Neck supple.  Skin:    General: Skin is warm and dry.  Neurological:     General: No focal deficit present.     Mental Status: She is alert.  Psychiatric:        Mood and Affect: Mood normal.        Behavior: Behavior normal.     (all labs ordered are listed, but only abnormal results are displayed) Labs Reviewed - No data to display  EKG: EKG Interpretation Date/Time:  Saturday August 16 2024 11:50:55 EST Ventricular Rate:  76 PR Interval:  150 QRS  Duration:  78 QT Interval:  396 QTC Calculation: 446 R Axis:   28  Text Interpretation: Sinus rhythm RSR' in V1 or V2, right VCD or RVH Confirmed by Mannie Pac [phone]) on 08/16/2024 12:21:08 PM  Radiology: [facility] Ribs Unilateral W/Chest Right Result Date: 08/16/2024 EXAM: AP VIEW XRAY OF THE UNILATERAL RIBS AND CHEST 08/16/2024 11:49:18 AM COMPARISON: PA and lateral radiographs of the chest dated 10/18/2021. CLINICAL HISTORY: injury injury injury injury injury FINDINGS: BONES: No acute displaced rib fracture. LUNGS AND PLEURA: No consolidation or pulmonary edema. No pleural effusion or pneumothorax. HEART AND MEDIASTINUM: No acute abnormality of the cardiac and mediastinal silhouettes. IMPRESSION: 1. No acute rib fracture. Electronically signed by: Evalene Coho MD 08/16/2024 12:02 PM EST RP Workstation: [id number]   DG Shoulder Right Result Date: 08/16/2024 EXAM: 1 VIEW(S) XRAY OF THE SHOULDER 08/16/2024 11:15:00 AM COMPARISON: None available. CLINICAL HISTORY: fall FINDINGS: BONES AND JOINTS: Glenohumeral joint is normally aligned. No acute fracture. No malalignment. [facility] joint is unremarkable. SOFT TISSUES: No abnormal calcifications. Visualized lung is unremarkable. IMPRESSION: 1. No evidence of acute traumatic injury. Electronically signed by: Waddell Calk MD 08/16/2024 11:44 AM EST RP Workstation: [id number]     Procedures   Medications Ordered in the ED  lidocaine  (LIDODERM ) 5 % 1 patch (1 patch Transdermal Patch Applied 08/16/24 1211)  ketorolac  (TORADOL ) 15 MG/ML injection 15 mg (15 mg Intramuscular Given 08/16/24 1210)                                    Medical Decision Making Amount and/or Complexity of Data Reviewed Radiology: ordered and independent interpretation performed. Decision-making details documented in ED Course.  Risk Prescription drug management.   This patient presents to the ED for concern of right sided rib pain, this involves an extensive number of  treatment options, and is a complaint that carries with it a high risk of complications and morbidity.  The differential diagnosis includes bony fracture, muscular strain, PTX, ACS, PE, etc  40 year old female presents to the ED due to right sided rib pain x 1 week.  Had a mechanical fall 1 week ago.  Pain resolved however, returned yesterday when her dog pulled her while on the leash.  No shortness of breath.  Upon arrival patient mildly tachycardic at 103.  No fever.  Normal O2 saturation.  Patient appears uncomfortable during initial evaluation.  Does have reproducible tenderness to right anterior chest wall without crepitus or deformity.  Lungs clear to auscultation bilaterally.  No lower extremity edema.  X-ray ordered to rule out bony fracture.  IM Toradol  given.  Lidoderm  patch placed.  Added EKG to rule out ACS however, my suspicion is lower for cardiac etiology of pain given reproducible nature on exam.  Likely MSK etiology. Low suspicion for PE.   X-rays personally reviewed and interpreted which are negative for any bony fractures. EKG NSR, HR 76.  Low suspicion for cardiac etiology of rib pain.  Low suspicion for PE.  Suspect musculoskeletal etiology given history of trauma and reproducible nature on exam.  Patient discharged with pain medication and muscle relaxers.  Advised patient to follow-up with PCP if symptoms do not improve over the next few days.  Patient stable for discharge. Strict ED precautions discussed with patient. Patient states understanding and agrees to plan. Patient discharged home in no acute distress and stable vitals  No PCP Hx HTN    Final diagnoses:  Fall, initial encounter  Chest wall pain    ED Discharge Orders          Ordered    naproxen  (NAPROSYN ) 500 MG tablet  2 times daily        08/16/24 1220    methocarbamol  (ROBAXIN ) 500 MG tablet  2 times daily        08/16/24 1220               Whitney [name], [place] 08/16/24 1222  "
